# Patient Record
Sex: Female | Born: 1970 | Race: Black or African American | Hispanic: No | Marital: Single | Smoking: Current some day smoker
Health system: Southern US, Community
[De-identification: ages and names within clinical notes are randomized; demographics above are authoritative.]

## PROBLEM LIST (undated history)

## (undated) DIAGNOSIS — K3533 Acute appendicitis with perforation and localized peritonitis, with abscess: Secondary | ICD-10-CM

## (undated) DIAGNOSIS — J45909 Unspecified asthma, uncomplicated: Secondary | ICD-10-CM

## (undated) DIAGNOSIS — Z6828 Body mass index (BMI) 28.0-28.9, adult: Secondary | ICD-10-CM

## (undated) HISTORY — PX: APPENDECTOMY: SHX54

## (undated) HISTORY — PX: TUBAL LIGATION: SHX77

## (undated) HISTORY — PX: FOOT SURGERY: SHX648

---

## 2000-08-28 ENCOUNTER — Other Ambulatory Visit: Admission: RE | Admit: 2000-08-28 | Discharge: 2000-08-28 | Payer: Self-pay | Admitting: Internal Medicine

## 2004-11-26 ENCOUNTER — Emergency Department (HOSPITAL_COMMUNITY): Admission: EM | Admit: 2004-11-26 | Discharge: 2004-11-26 | Payer: Self-pay | Admitting: Family Medicine

## 2005-01-07 ENCOUNTER — Emergency Department (HOSPITAL_COMMUNITY): Admission: EM | Admit: 2005-01-07 | Discharge: 2005-01-07 | Payer: Self-pay | Admitting: Emergency Medicine

## 2007-04-25 ENCOUNTER — Emergency Department (HOSPITAL_COMMUNITY): Admission: EM | Admit: 2007-04-25 | Discharge: 2007-04-25 | Payer: Self-pay | Admitting: Emergency Medicine

## 2008-01-12 ENCOUNTER — Emergency Department (HOSPITAL_COMMUNITY): Admission: EM | Admit: 2008-01-12 | Discharge: 2008-01-12 | Payer: Self-pay | Admitting: Emergency Medicine

## 2008-06-21 ENCOUNTER — Emergency Department (HOSPITAL_COMMUNITY): Admission: EM | Admit: 2008-06-21 | Discharge: 2008-06-21 | Payer: Self-pay | Admitting: Emergency Medicine

## 2008-11-10 ENCOUNTER — Emergency Department (HOSPITAL_COMMUNITY): Admission: AC | Admit: 2008-11-10 | Discharge: 2008-11-10 | Payer: Self-pay

## 2008-11-11 ENCOUNTER — Emergency Department (HOSPITAL_COMMUNITY): Admission: EM | Admit: 2008-11-11 | Discharge: 2008-11-11 | Payer: Self-pay | Admitting: Emergency Medicine

## 2011-04-25 LAB — POCT URINALYSIS DIP (DEVICE)
Nitrite: NEGATIVE
Protein, ur: NEGATIVE
Specific Gravity, Urine: >=1.03
Urobilinogen, UA: 1
pH: 6.5

## 2011-04-25 LAB — WET PREP, GENITAL
Clue Cells Wet Prep HPF POC: NONE SEEN
WBC, Wet Prep HPF POC: NONE SEEN

## 2011-04-25 LAB — GC/CHLAMYDIA PROBE AMP, GENITAL
Chlamydia, DNA Probe: NEGATIVE
GC Probe Amp, Genital: NEGATIVE

## 2011-04-25 LAB — POCT PREGNANCY, URINE: Operator id: 239701

## 2011-05-23 ENCOUNTER — Emergency Department (HOSPITAL_COMMUNITY)
Admission: EM | Admit: 2011-05-23 | Discharge: 2011-05-23 | Disposition: A | Discharge status: Home / Self Care | Payer: Medicaid Other | Attending: Emergency Medicine | Admitting: Emergency Medicine

## 2011-05-23 ENCOUNTER — Encounter: Payer: Self-pay | Admitting: Adult Health

## 2011-05-23 DIAGNOSIS — J069 Acute upper respiratory infection, unspecified: Secondary | ICD-10-CM | POA: Insufficient documentation

## 2011-05-23 DIAGNOSIS — H9209 Otalgia, unspecified ear: Secondary | ICD-10-CM | POA: Insufficient documentation

## 2011-05-23 DIAGNOSIS — R05 Cough: Secondary | ICD-10-CM | POA: Insufficient documentation

## 2011-05-23 DIAGNOSIS — R059 Cough, unspecified: Secondary | ICD-10-CM | POA: Insufficient documentation

## 2011-05-23 DIAGNOSIS — R509 Fever, unspecified: Secondary | ICD-10-CM | POA: Insufficient documentation

## 2011-05-23 MED ORDER — ALBUTEROL SULFATE HFA 108 (90 BASE) MCG/ACT IN AERS
2.0000 | INHALATION_SPRAY | RESPIRATORY_TRACT | Status: DC | PRN
  Administered 2011-05-23: 2 via RESPIRATORY_TRACT
  Filled 2011-05-23: qty 6.7

## 2011-05-23 MED ORDER — AZITHROMYCIN 250 MG PO TABS: 250.0000 mg | ORAL_TABLET | Freq: Every day | ORAL | Status: AC

## 2011-05-23 NOTE — ED Notes (Signed)
C/o upper respiratory symptoms, ran out of al;buterol and inhaler and sore throat. Alert and oriented.

## 2011-05-23 NOTE — ED Provider Notes (Signed)
History     CSN: [id number] Arrival date & time: 05/23/2011  1:07 PM   First MD Initiated Contact with Patient 05/23/11 1329      Chief Complaint  Patient presents with  . URI    (Consider location/radiation/quality/duration/timing/severity/associated sxs/prior treatment) Patient is a 40 y.o. female presenting with URI. The history is provided by the patient. No language interpreter was used.  URI The primary symptoms include fever, ear pain, sore throat and cough. Primary symptoms do not include headaches, wheezing, abdominal pain, nausea or vomiting. The current episode started 6 to 7 days ago. This is a new problem. The problem has not changed since onset.  Reports sinus and chest congestion x 1 weeks.  States that she had a fever of 101 on Tuesday.  Coughing up yellow sputum.  Sore throat started yesterday.  Denies difficulty swallowing, headache or chills today.   Past Medical History  Diagnosis Date  . Asthma     History reviewed. No pertinent past surgical history.  History reviewed. No pertinent family history.  History  Substance Use Topics  . Smoking status: Current Some Day Smoker  . Smokeless tobacco: Not on file  . Alcohol Use: Yes    OB History    Grav Para Term Preterm Abortions TAB SAB Ect Mult Living                  Review of Systems  Constitutional: Positive for fever.  HENT: Positive for ear pain and sore throat.   Respiratory: Positive for cough. Negative for wheezing.   Gastrointestinal: Negative for nausea, vomiting and abdominal pain.  Neurological: Negative for headaches.  All other systems reviewed and are negative.    Allergies  Penicillins  Home Medications   Current Outpatient Rx  Name Route Sig Dispense Refill  . ALBUTEROL SULFATE HFA 108 (90 BASE) MCG/ACT IN AERS Inhalation Inhale 2 puffs into the lungs every 4 (four) hours as needed. For tightness     . IPRATROPIUM-ALBUTEROL 18-103 MCG/ACT IN AERO Inhalation Inhale 2 puffs  into the lungs every 6 (six) hours as needed.     . ALBUTEROL SULFATE (5 MG/ML) 0.5% IN NEBU Nebulization Take 2.5 mg by nebulization every 4 (four) hours as needed.       BP 131/79  Pulse 114  Temp(Src) 99.5 F (37.5 C) (Oral)  Resp 18  SpO2 100%  Physical Exam  Nursing note and vitals reviewed. Constitutional: She is oriented to person, place, and time. She appears well-developed and well-nourished.  HENT:  Head: Normocephalic and atraumatic.  Right Ear: Hearing normal. Tympanic membrane is bulging.  Left Ear: Hearing normal. Tympanic membrane is bulging.  Mouth/Throat: Posterior oropharyngeal erythema present.  Eyes: Conjunctivae and EOM are normal. Pupils are equal, round, and reactive to light.  Neck: Normal range of motion. Neck supple.  Cardiovascular: Normal rate, regular rhythm, normal heart sounds and intact distal pulses.  Exam reveals no gallop and no friction rub.   No murmur heard. Pulmonary/Chest: Effort normal and breath sounds normal.  Abdominal: Soft. Bowel sounds are normal.  Musculoskeletal: Normal range of motion. She exhibits no edema and no tenderness.  Neurological: She is alert and oriented to person, place, and time. She has normal reflexes.  Skin: Skin is warm and dry.  Psychiatric: She has a normal mood and affect.    ED Course  Procedures (including critical care time)  Labs Reviewed - No data to display No results found.   No diagnosis found.  MDM          Jethro Bastos, NP 05/23/11 1558

## 2011-05-24 NOTE — ED Provider Notes (Signed)
Medical screening examination/treatment/procedure(s) were performed by non-physician practitioner and as supervising physician I was immediately available for consultation/collaboration.  Geoffery Lyons, MD 05/24/11 1536

## 2013-02-14 ENCOUNTER — Inpatient Hospital Stay (HOSPITAL_COMMUNITY)
Admission: EM | Admit: 2013-02-14 | Discharge: 2013-02-18 | DRG: 340 | Disposition: A | Discharge status: Home / Self Care | Payer: Medicaid Other | Attending: General Surgery | Admitting: General Surgery

## 2013-02-14 ENCOUNTER — Encounter (HOSPITAL_COMMUNITY): Admission: EM | Disposition: A | Discharge status: Home / Self Care | Payer: Self-pay | Source: Home / Self Care

## 2013-02-14 ENCOUNTER — Emergency Department (HOSPITAL_COMMUNITY): Payer: Medicaid Other

## 2013-02-14 ENCOUNTER — Encounter (HOSPITAL_COMMUNITY): Payer: Self-pay | Admitting: *Deleted

## 2013-02-14 DIAGNOSIS — K3580 Unspecified acute appendicitis: Secondary | ICD-10-CM

## 2013-02-14 DIAGNOSIS — K35209 Acute appendicitis with generalized peritonitis, without abscess, unspecified as to perforation: Principal | ICD-10-CM | POA: Diagnosis present

## 2013-02-14 DIAGNOSIS — K3533 Acute appendicitis with perforation and localized peritonitis, with abscess: Secondary | ICD-10-CM | POA: Diagnosis present

## 2013-02-14 DIAGNOSIS — J45909 Unspecified asthma, uncomplicated: Secondary | ICD-10-CM | POA: Diagnosis present

## 2013-02-14 DIAGNOSIS — Z9851 Tubal ligation status: Secondary | ICD-10-CM

## 2013-02-14 DIAGNOSIS — R112 Nausea with vomiting, unspecified: Secondary | ICD-10-CM | POA: Diagnosis present

## 2013-02-14 DIAGNOSIS — Z6828 Body mass index (BMI) 28.0-28.9, adult: Secondary | ICD-10-CM

## 2013-02-14 DIAGNOSIS — K352 Acute appendicitis with generalized peritonitis, without abscess: Principal | ICD-10-CM | POA: Diagnosis present

## 2013-02-14 DIAGNOSIS — R51 Headache: Secondary | ICD-10-CM | POA: Diagnosis present

## 2013-02-14 DIAGNOSIS — F17200 Nicotine dependence, unspecified, uncomplicated: Secondary | ICD-10-CM | POA: Diagnosis present

## 2013-02-14 HISTORY — DX: Body mass index (bmi) 28.0-28.9, adult: Z68.28

## 2013-02-14 HISTORY — DX: Acute appendicitis with perforation and localized peritonitis, with abscess: K35.33

## 2013-02-14 HISTORY — PX: LAPAROSCOPIC APPENDECTOMY: SHX408

## 2013-02-14 HISTORY — DX: Unspecified asthma, uncomplicated: J45.909

## 2013-02-14 LAB — URINALYSIS W MICROSCOPIC + REFLEX CULTURE
Glucose, UA: NEGATIVE mg/dL
Leukocytes, UA: NEGATIVE
pH: 7 (ref 5.0–8.0)

## 2013-02-14 LAB — CBC WITH DIFFERENTIAL/PLATELET
Basophils Absolute: 0 10*3/uL (ref 0.0–0.1)
Lymphs Abs: 1.1 10*3/uL (ref 0.7–4.0)
MCV: 73.6 fL — ABNORMAL LOW (ref 78.0–100.0)
Monocytes Relative: 6 % (ref 3–12)
Neutrophils Relative %: 88 % — ABNORMAL HIGH (ref 43–77)
Platelets: 344 10*3/uL (ref 150–400)
RDW: 14.4 % (ref 11.5–15.5)
WBC: 17.5 10*3/uL — ABNORMAL HIGH (ref 4.0–10.5)

## 2013-02-14 LAB — COMPREHENSIVE METABOLIC PANEL
AST: 15 U/L (ref 0–37)
Albumin: 4 g/dL (ref 3.5–5.2)
Calcium: 9.9 mg/dL (ref 8.4–10.5)
Chloride: 101 mEq/L (ref 96–112)
Creatinine, Ser: 0.74 mg/dL (ref 0.50–1.10)
Sodium: 139 mEq/L (ref 135–145)
Total Bilirubin: 0.4 mg/dL (ref 0.3–1.2)

## 2013-02-14 SURGERY — APPENDECTOMY, LAPAROSCOPIC
Anesthesia: General | Wound class: Contaminated

## 2013-02-14 MED ORDER — CIPROFLOXACIN IN D5W 400 MG/200ML IV SOLN
400.0000 mg | Freq: Two times a day (BID) | INTRAVENOUS | Status: DC
  Administered 2013-02-14 – 2013-02-15 (×3): 400 mg via INTRAVENOUS
  Filled 2013-02-14 (×4): qty 200

## 2013-02-14 MED ORDER — IOHEXOL 300 MG/ML  SOLN
50.0000 mL | Freq: Once | INTRAMUSCULAR | Status: AC | PRN
  Administered 2013-02-14: 50 mL via ORAL

## 2013-02-14 MED ORDER — ONDANSETRON HCL 4 MG/2ML IJ SOLN
4.0000 mg | Freq: Once | INTRAMUSCULAR | Status: AC
  Administered 2013-02-14: 4 mg via INTRAVENOUS
  Filled 2013-02-14: qty 2

## 2013-02-14 MED ORDER — FENTANYL CITRATE 0.05 MG/ML IJ SOLN
50.0000 ug | Freq: Once | INTRAMUSCULAR | Status: AC
  Administered 2013-02-14: 50 ug via INTRAVENOUS
  Filled 2013-02-14: qty 2

## 2013-02-14 MED ORDER — SODIUM CHLORIDE 0.9 % IV BOLUS (SEPSIS)
1000.0000 mL | Freq: Once | INTRAVENOUS | Status: AC
  Administered 2013-02-14: 1000 mL via INTRAVENOUS

## 2013-02-14 MED ORDER — HYDROMORPHONE HCL PF 1 MG/ML IJ SOLN
1.0000 mg | Freq: Once | INTRAMUSCULAR | Status: AC
Start: 2013-02-14 — End: 2013-02-14
  Administered 2013-02-14: 1 mg via INTRAVENOUS
  Filled 2013-02-14: qty 1

## 2013-02-14 MED ORDER — HYDROMORPHONE HCL PF 1 MG/ML IJ SOLN
1.0000 mg | Freq: Once | INTRAMUSCULAR | Status: AC
  Administered 2013-02-14: 1 mg via INTRAVENOUS
  Filled 2013-02-14: qty 1

## 2013-02-14 MED ORDER — SODIUM CHLORIDE 0.9 % IV SOLN
Freq: Once | INTRAVENOUS | Status: AC
  Administered 2013-02-14: 20:00:00 via INTRAVENOUS

## 2013-02-14 MED ORDER — METRONIDAZOLE IN NACL 5-0.79 MG/ML-% IV SOLN
500.0000 mg | Freq: Three times a day (TID) | INTRAVENOUS | Status: DC
  Administered 2013-02-14 – 2013-02-16 (×5): 500 mg via INTRAVENOUS
  Filled 2013-02-14 (×6): qty 100

## 2013-02-14 MED ORDER — IOHEXOL 300 MG/ML  SOLN
100.0000 mL | Freq: Once | INTRAMUSCULAR | Status: AC | PRN
  Administered 2013-02-14: 100 mL via INTRAVENOUS

## 2013-02-14 SURGICAL SUPPLY — 40 items
APPLIER CLIP 5 13 M/L LIGAMAX5 (MISCELLANEOUS)
APPLIER CLIP ROT 10 11.4 M/L (STAPLE) ×2
CANISTER SUCTION 2500CC (MISCELLANEOUS) ×2 IMPLANT
CHLORAPREP W/TINT 26ML (MISCELLANEOUS) ×2 IMPLANT
CLIP APPLIE 5 13 M/L LIGAMAX5 (MISCELLANEOUS) IMPLANT
CLIP APPLIE ROT 10 11.4 M/L (STAPLE) ×1 IMPLANT
CLOTH BEACON ORANGE TIMEOUT ST (SAFETY) ×2 IMPLANT
CUTTER FLEX LINEAR 45M (STAPLE) ×2 IMPLANT
DECANTER SPIKE VIAL GLASS SM (MISCELLANEOUS) IMPLANT
DERMABOND ADVANCED (GAUZE/BANDAGES/DRESSINGS) ×1
DERMABOND ADVANCED .7 DNX12 (GAUZE/BANDAGES/DRESSINGS) ×1 IMPLANT
DRAPE LAPAROSCOPIC ABDOMINAL (DRAPES) ×2 IMPLANT
ELECT REM PT RETURN 9FT ADLT (ELECTROSURGICAL) ×2
ELECTRODE REM PT RTRN 9FT ADLT (ELECTROSURGICAL) ×1 IMPLANT
GLOVE BIOGEL PI IND STRL 7.0 (GLOVE) ×1 IMPLANT
GLOVE BIOGEL PI INDICATOR 7.0 (GLOVE) ×1
GLOVE SS BIOGEL STRL SZ 7.5 (GLOVE) ×1 IMPLANT
GLOVE SUPERSENSE BIOGEL SZ 7.5 (GLOVE) ×1
GOWN STRL NON-REIN LRG LVL3 (GOWN DISPOSABLE) ×2 IMPLANT
GOWN STRL REIN XL XLG (GOWN DISPOSABLE) ×4 IMPLANT
IV LACTATED RINGERS 1000ML (IV SOLUTION) ×4 IMPLANT
KIT BASIN OR (CUSTOM PROCEDURE TRAY) ×2 IMPLANT
NS IRRIG 1000ML POUR BTL (IV SOLUTION) ×2 IMPLANT
PENCIL BUTTON HOLSTER BLD 10FT (ELECTRODE) IMPLANT
POUCH SPECIMEN RETRIEVAL 10MM (ENDOMECHANICALS) ×2 IMPLANT
RELOAD 45 VASCULAR/THIN (ENDOMECHANICALS) IMPLANT
RELOAD STAPLE TA45 3.5 REG BLU (ENDOMECHANICALS) ×2 IMPLANT
SCALPEL HARMONIC ACE (MISCELLANEOUS) ×2 IMPLANT
SET IRRIG TUBING LAPAROSCOPIC (IRRIGATION / IRRIGATOR) ×4 IMPLANT
SOLUTION ANTI FOG 6CC (MISCELLANEOUS) ×2 IMPLANT
STRIP CLOSURE SKIN 1/2X4 (GAUZE/BANDAGES/DRESSINGS) IMPLANT
SUT MNCRL AB 4-0 PS2 18 (SUTURE) ×2 IMPLANT
TOWEL OR 17X26 10 PK STRL BLUE (TOWEL DISPOSABLE) ×2 IMPLANT
TRAY FOLEY CATH 14FRSI W/METER (CATHETERS) ×2 IMPLANT
TRAY LAP CHOLE (CUSTOM PROCEDURE TRAY) ×2 IMPLANT
TROCAR BLADELESS OPT 5 75 (ENDOMECHANICALS) ×2 IMPLANT
TROCAR XCEL 12X100 BLDLESS (ENDOMECHANICALS) ×4 IMPLANT
TROCAR XCEL BLUNT TIP 100MML (ENDOMECHANICALS) ×2 IMPLANT
TROCAR Z-THREAD FIOS 12X100MM (TROCAR) ×2 IMPLANT
TUBING INSUFFLATION 10FT LAP (TUBING) ×2 IMPLANT

## 2013-02-14 NOTE — H&P (Signed)
Stephanie Mcgrath is an 42 y.o. female.   Chief Complaint: abdominal pain, nausea and vomiting  HPI: patient states that about 24 hours ago after eating some food cookout she developed the onset of nausea and infrequent vomiting. She initially thought she might have had food poisoning. However today she developed progressive abdominal pain. She describes constant aching pain mostly in her epigastrium with some radiation to the lower abdomen as well. The pain has been constant and worse with motion. She's continued to have nausea and vomiting. She presented to the emergency room for evaluation. No GU symptoms. No history of similar problems or chronic GI complaints.  Past Medical History  Diagnosis Date  . Asthma     Past Surgical History  Procedure Laterality Date  . Foot surgery      left  . Tubal ligation    . Cesarean section      No family history on file. Social History:  reports that she has been smoking.  She has never used smokeless tobacco. She reports that  drinks alcohol. She reports that she does not use illicit drugs.  Allergies:  Allergies  Allergen Reactions  . Penicillins Swelling    Eyes swell   No current facility-administered medications for this encounter.   Current Outpatient Prescriptions  Medication Sig Dispense Refill  . albuterol (PROVENTIL HFA;VENTOLIN HFA) 108 (90 BASE) MCG/ACT inhaler Inhale 2 puffs into the lungs every 4 (four) hours as needed. For tightness       . albuterol-ipratropium (COMBIVENT) 18-103 MCG/ACT inhaler Inhale 2 puffs into the lungs every 6 (six) hours as needed.       . Lorcaserin HCl (BELVIQ) 10 MG TABS Take 20 mg by mouth daily.      . Multiple Vitamin (MULTIVITAMIN WITH MINERALS) TABS Take 1 tablet by mouth daily.      Marland Kitchen albuterol (PROVENTIL) (5 MG/ML) 0.5% nebulizer solution Take 2.5 mg by nebulization every 4 (four) hours as needed.          Results for orders placed during the hospital encounter of 02/14/13 (from the past 48  hour(s))  CBC WITH DIFFERENTIAL     Status: Abnormal   Collection Time    02/14/13  6:17 PM      Result Value Range   WBC 17.5 (*) 4.0 - 10.5 K/uL   RBC 5.12 (*) 3.87 - 5.11 MIL/uL   Hemoglobin 12.4  12.0 - 15.0 g/dL   HCT 16.1  09.6 - 04.5 %   MCV 73.6 (*) 78.0 - 100.0 fL   MCH 24.2 (*) 26.0 - 34.0 pg   MCHC 32.9  30.0 - 36.0 g/dL   RDW 40.9  81.1 - 91.4 %   Platelets 344  150 - 400 K/uL   Neutrophils Relative % 88 (*) 43 - 77 %   Lymphocytes Relative 6 (*) 12 - 46 %   Monocytes Relative 6  3 - 12 %   Eosinophils Relative 0  0 - 5 %   Basophils Relative 0  0 - 1 %   Neutro Abs 15.3 (*) 1.7 - 7.7 K/uL   Lymphs Abs 1.1  0.7 - 4.0 K/uL   Monocytes Absolute 1.1 (*) 0.1 - 1.0 K/uL   Eosinophils Absolute 0.0  0.0 - 0.7 K/uL   Basophils Absolute 0.0  0.0 - 0.1 K/uL   Smear Review MORPHOLOGY UNREMARKABLE    COMPREHENSIVE METABOLIC PANEL     Status: Abnormal   Collection Time    02/14/13  6:17  PM      Result Value Range   Sodium 139  135 - 145 mEq/L   Potassium 3.6  3.5 - 5.1 mEq/L   Chloride 101  96 - 112 mEq/L   CO2 27  19 - 32 mEq/L   Glucose, Bld 108 (*) 70 - 99 mg/dL   BUN 11  6 - 23 mg/dL   Creatinine, Ser 1.61  0.50 - 1.10 mg/dL   Calcium 9.9  8.4 - 09.6 mg/dL   Total Protein 7.6  6.0 - 8.3 g/dL   Albumin 4.0  3.5 - 5.2 g/dL   AST 15  0 - 37 U/L   ALT 5  0 - 35 U/L   Alkaline Phosphatase 63  39 - 117 U/L   Total Bilirubin 0.4  0.3 - 1.2 mg/dL   GFR calc non Af Amer >90  >90 mL/min   GFR calc Af Amer >90  >90 mL/min   Comment:            The eGFR has been calculated     using the CKD EPI equation.     This calculation has not been     validated in all clinical     situations.     eGFR's persistently     <90 mL/min signify     possible Chronic Kidney Disease.  LIPASE, BLOOD     Status: None   Collection Time    02/14/13  6:17 PM      Result Value Range   Lipase 23  11 - 59 U/L  URINALYSIS W MICROSCOPIC + REFLEX CULTURE     Status: Abnormal   Collection Time     02/14/13  7:21 PM      Result Value Range   Color, Urine YELLOW  YELLOW   APPearance CLEAR  CLEAR   Specific Gravity, Urine 1.022  1.005 - 1.030   pH 7.0  5.0 - 8.0   Glucose, UA NEGATIVE  NEGATIVE mg/dL   Hgb urine dipstick LARGE (*) NEGATIVE   Bilirubin Urine NEGATIVE  NEGATIVE   Ketones, ur NEGATIVE  NEGATIVE mg/dL   Protein, ur NEGATIVE  NEGATIVE mg/dL   Urobilinogen, UA 0.2  0.0 - 1.0 mg/dL   Nitrite NEGATIVE  NEGATIVE   Leukocytes, UA NEGATIVE  NEGATIVE   WBC, UA 0-2  <3 WBC/hpf   RBC / HPF 21-50  <3 RBC/hpf   Squamous Epithelial / LPF RARE  RARE   Urine-Other MUCOUS PRESENT    POCT PREGNANCY, URINE     Status: None   Collection Time    02/14/13  7:32 PM      Result Value Range   Preg Test, Ur NEGATIVE  NEGATIVE   Comment:            THE SENSITIVITY OF THIS     METHODOLOGY IS >24 mIU/mL   US Abdomen Complete  02/14/2013   *RADIOLOGY REPORT*  Clinical Data:  Abdominal pain, emesis  ABDOMINAL ULTRASOUND COMPLETE  Comparison:  None.  Findings:  Gallbladder:  No gallstones, gallbladder wall thickening, or pericholecystic fluid.  Common Bile Duct:  Within normal limits in caliber.  Liver: No focal mass lesion identified.  Within normal limits in parenchymal echogenicity.  IVC:  Appears normal.  Pancreas:  No abnormality identified.  Spleen:  Within normal limits in size and echotexture.  Right kidney:  Normal in size and parenchymal echogenicity.  No evidence of mass or hydronephrosis.  Left kidney:  Normal in size and  parenchymal echogenicity.  No evidence of mass or hydronephrosis.  Abdominal Aorta:  No aneurysm identified.  IMPRESSION: No acute finding by abdominal ultrasound   Original Report Authenticated By: Judie Petit. Miles Costain, M.D.   Ct Abdomen Pelvis W Contrast  02/14/2013   *RADIOLOGY REPORT*  Clinical Data: Abdominal pain, emesis  CT ABDOMEN AND PELVIS WITH CONTRAST  Technique:  Multidetector CT imaging of the abdomen and pelvis was performed following the standard protocol during  bolus administration of intravenous contrast.  Contrast: 50mL OMNIPAQUE IOHEXOL 300 MG/ML  SOLN, OMNIPAQUE IOHEXOL 300 MG/ML  SOLN  Comparison: 02/14/2013 ultrasound  Findings: Lung bases clear.  Normal heart size.  No pericardial or pleural effusion.  No hiatal hernia.  Abdomen:  Liver demonstrates scattered sub centimeter hypodensities in the dome, too small to definitively characterize but suspect small hepatic cysts.  Liver is scanned in the early portal phase. Therefore the hepatic veins are not well opacified.  Left hepatic lobe medial segment demonstrates subcapsular hypoattenuation, suspect geographic fatty infiltration.  Gallbladder, biliary system, pancreas, spleen, adrenal glands, and kidneys demonstrate no acute process and are within normal limits for age.  Negative for bowel obstruction, dilatation, ileus, or free air.  In the right lower quadrant, the appendix is fluid distended with enhancement and surrounding inflammation compatible with acute appendicitis, non ruptured.  Pelvis:  Tubal ligation clips noted.  Uterus normal in size.  Small dominant follicle measures 14 mm on the left ovary.  Small amount pelvic free fluid dependently.  No other acute distal bowel process involving the colon.  Colon is collapsed.  No pelvic hemorrhage, abscess, adenopathy, inguinal abnormality, or hernia.  Mild degenerative changes of the spine.  No acute osseous finding.  IMPRESSION: Acute non ruptured appendicitis.  Hepatic sub-centimeter hypodensities, too small to definitively characterize but suspect small cysts  Triangular subcapsular hypodense area in the left hepatic lobe medial segment, suspect geographic fatty infiltration  Prior tubal ligation  14 mm dominant left ovarian follicle  Trace pelvic free fluid  Critical Value/emergent results were called by telephone at the time of interpretation on 02/14/2013 at 9:35 p.m. to Dr. Gwendolyn Grant, who verbally acknowledged these results.   Original Report  Authenticated By: Judie Petit. Miles Costain, M.D.    Review of Systems  Constitutional: Negative for fever and chills.  HENT: Negative.   Respiratory: Negative.   Cardiovascular: Negative.   Gastrointestinal: Positive for nausea, vomiting and abdominal pain. Negative for diarrhea, constipation and blood in stool.  Genitourinary: Negative.     Blood pressure 121/75, pulse 75, temperature 98.7 F (37.1 C), temperature source Oral, resp. rate 20, last menstrual period 02/08/2013, SpO2 95.00%. Physical Exam  General: Alert, moderately obese African American female, in no distress Skin: Warm and dry without rash or infection. HEENT: No palpable masses or thyromegaly. Sclera nonicteric. Pupils equal round and reactive. Oropharynx clear. Lymph nodes: No cervical, supraclavicular, or inguinal nodes palpable. Lungs: Breath sounds clear and equal without increased work of breathing Cardiovascular: Regular rate and rhythm without murmur. No JVD or edema. Peripheral pulses intact. Abdomen: Nondistended. Somewhat diffuse tenderness and guarding, greater on the right side of the abdomen than the left. No discernible masses or organomegaly. No hernias. Extremities: No edema or joint swelling or deformity. No chronic venous stasis changes. Neurologic: Alert and fully oriented. Gait normal.   Assessment/Plan I have personally reviewed her imaging studies. Her tenderness is not well localized to the right lower quadrant but CT scan clearly shows acute appendicitis without other abnormalities in her presentation  exam is consistent with this. I recommended emergency laparoscopic appendectomy. We discussed the nature of the procedure and its indications and recovery as well as risks of anesthetic complications, cardiorespiratory complications, bleeding and infection and possible need for open surgery. All questions were answered. She is receiving broad-spectrum IV antibiotics.  Dmarius Reeder T 02/14/2013, 10:32 PM

## 2013-02-14 NOTE — ED Provider Notes (Signed)
Medical screening examination/treatment/procedure(s) were conducted as a shared visit with non-physician practitioner(s) and myself.  I personally evaluated the patient during the encounter I evaluated patient, 24 hours about abdominal pain. Mostly RUQ pain initally, RUQ negative. CT scan shows appendicitis. Surgery admitting.  Dagmar Hait, MD 02/14/13 [phone]

## 2013-02-14 NOTE — ED Provider Notes (Signed)
CSN: [id number]     Arrival date & time 02/14/13  1732 History     First MD Initiated Contact with Patient 02/14/13 1748     Chief Complaint  Patient presents with  . Abdominal Pain  . Emesis   (Consider location/radiation/quality/duration/timing/severity/associated sxs/prior Treatment) HPI Stephanie Mcgrath is a 42 y.o. female who presents to ED with complaint of nausea vomiting, abdominal pain. States symptoms began last night around 10pm. Stats not too long prior to that she ate a bloody hamburger from cook out. States persistent vomiting since then. No hematemesis. No diarrhea. No fever, chills. States today took zofran and took oxycodone with no improvement. Pt had tubal ligation 2 wks ago. States doing well since then. Pt denies anyone in the family sick with the same.    Past Medical History  Diagnosis Date  . Asthma    Past Surgical History  Procedure Laterality Date  . Foot surgery      left  . Tubal ligation    . Cesarean section     No family history on file. History  Substance Use Topics  . Smoking status: Current Some Day Smoker  . Smokeless tobacco: Never Used  . Alcohol Use: Yes   OB History   Grav Para Term Preterm Abortions TAB SAB Ect Mult Living                 Review of Systems  Constitutional: Negative for fever and chills.  HENT: Negative for neck pain and neck stiffness.   Respiratory: Negative.   Cardiovascular: Negative.   Gastrointestinal: Positive for nausea, vomiting and abdominal pain. Negative for diarrhea, constipation, blood in stool and rectal pain.  Genitourinary: Negative for dysuria, flank pain, vaginal bleeding, vaginal discharge, vaginal pain and pelvic pain.  Musculoskeletal: Negative.   Skin: Negative.   Neurological: Negative for weakness, numbness and headaches.  All other systems reviewed and are negative.    Allergies  Penicillins  Home Medications   Current Outpatient Rx  Name  Route  Sig  Dispense  Refill  .  albuterol (PROVENTIL HFA;VENTOLIN HFA) 108 (90 BASE) MCG/ACT inhaler   Inhalation   Inhale 2 puffs into the lungs every 4 (four) hours as needed. For tightness          . albuterol-ipratropium (COMBIVENT) 18-103 MCG/ACT inhaler   Inhalation   Inhale 2 puffs into the lungs every 6 (six) hours as needed.          . Lorcaserin HCl (BELVIQ) 10 MG TABS   Oral   Take 20 mg by mouth daily.         . Multiple Vitamin (MULTIVITAMIN WITH MINERALS) TABS   Oral   Take 1 tablet by mouth daily.         Marland Kitchen albuterol (PROVENTIL) (5 MG/ML) 0.5% nebulizer solution   Nebulization   Take 2.5 mg by nebulization every 4 (four) hours as needed.           BP 137/73  Pulse 72  Temp(Src) 99.2 F (37.3 C) (Oral)  Resp 18  SpO2 100%  LMP 02/08/2013 Physical Exam  Nursing note and vitals reviewed. Constitutional: She appears well-developed and well-nourished.  Uncomfortable appearing  Eyes: Conjunctivae are normal.  Neck: Neck supple.  Cardiovascular: Normal rate, regular rhythm and normal heart sounds.   Pulmonary/Chest: Effort normal and breath sounds normal. No respiratory distress. She has no wheezes. She has no rales.  Abdominal: Soft. Bowel sounds are normal. She exhibits no distension. There is  tenderness. There is guarding.  RUQ and epigastric tenderness. No CVA tenderness  Musculoskeletal: She exhibits no edema.    ED Course   Procedures (including critical care time)  Results for orders placed during the hospital encounter of 02/14/13  CBC WITH DIFFERENTIAL      Result Value Range   WBC 17.5 (*) 4.0 - 10.5 K/uL   RBC 5.12 (*) 3.87 - 5.11 MIL/uL   Hemoglobin 12.4  12.0 - 15.0 g/dL   HCT 16.1  09.6 - 04.5 %   MCV 73.6 (*) 78.0 - 100.0 fL   MCH 24.2 (*) 26.0 - 34.0 pg   MCHC 32.9  30.0 - 36.0 g/dL   RDW 40.9  81.1 - 91.4 %   Platelets 344  150 - 400 K/uL   Neutrophils Relative % 88 (*) 43 - 77 %   Lymphocytes Relative 6 (*) 12 - 46 %   Monocytes Relative 6  3 - 12 %    Eosinophils Relative 0  0 - 5 %   Basophils Relative 0  0 - 1 %   Neutro Abs 15.3 (*) 1.7 - 7.7 K/uL   Lymphs Abs 1.1  0.7 - 4.0 K/uL   Monocytes Absolute 1.1 (*) 0.1 - 1.0 K/uL   Eosinophils Absolute 0.0  0.0 - 0.7 K/uL   Basophils Absolute 0.0  0.0 - 0.1 K/uL   Smear Review MORPHOLOGY UNREMARKABLE    COMPREHENSIVE METABOLIC PANEL      Result Value Range   Sodium 139  135 - 145 mEq/L   Potassium 3.6  3.5 - 5.1 mEq/L   Chloride 101  96 - 112 mEq/L   CO2 27  19 - 32 mEq/L   Glucose, Bld 108 (*) 70 - 99 mg/dL   BUN 11  6 - 23 mg/dL   Creatinine, Ser 7.82  0.50 - 1.10 mg/dL   Calcium 9.9  8.4 - 95.6 mg/dL   Total Protein 7.6  6.0 - 8.3 g/dL   Albumin 4.0  3.5 - 5.2 g/dL   AST 15  0 - 37 U/L   ALT 5  0 - 35 U/L   Alkaline Phosphatase 63  39 - 117 U/L   Total Bilirubin 0.4  0.3 - 1.2 mg/dL   GFR calc non Af Amer >90  >90 mL/min   GFR calc Af Amer >90  >90 mL/min  LIPASE, BLOOD      Result Value Range   Lipase 23  11 - 59 U/L  URINALYSIS W MICROSCOPIC + REFLEX CULTURE      Result Value Range   Color, Urine YELLOW  YELLOW   APPearance CLEAR  CLEAR   Specific Gravity, Urine 1.022  1.005 - 1.030   pH 7.0  5.0 - 8.0   Glucose, UA NEGATIVE  NEGATIVE mg/dL   Hgb urine dipstick LARGE (*) NEGATIVE   Bilirubin Urine NEGATIVE  NEGATIVE   Ketones, ur NEGATIVE  NEGATIVE mg/dL   Protein, ur NEGATIVE  NEGATIVE mg/dL   Urobilinogen, UA 0.2  0.0 - 1.0 mg/dL   Nitrite NEGATIVE  NEGATIVE   Leukocytes, UA NEGATIVE  NEGATIVE   WBC, UA 0-2  <3 WBC/hpf   RBC / HPF 21-50  <3 RBC/hpf   Squamous Epithelial / LPF RARE  RARE   Urine-Other MUCOUS PRESENT    POCT PREGNANCY, URINE      Result Value Range   Preg Test, Ur NEGATIVE  NEGATIVE   US Abdomen Complete  02/14/2013   *RADIOLOGY REPORT*  Clinical Data:  Abdominal pain, emesis  ABDOMINAL ULTRASOUND COMPLETE  Comparison:  None.  Findings:  Gallbladder:  No gallstones, gallbladder wall thickening, or pericholecystic fluid.  Common Bile Duct:   Within normal limits in caliber.  Liver: No focal mass lesion identified.  Within normal limits in parenchymal echogenicity.  IVC:  Appears normal.  Pancreas:  No abnormality identified.  Spleen:  Within normal limits in size and echotexture.  Right kidney:  Normal in size and parenchymal echogenicity.  No evidence of mass or hydronephrosis.  Left kidney:  Normal in size and parenchymal echogenicity.  No evidence of mass or hydronephrosis.  Abdominal Aorta:  No aneurysm identified.  IMPRESSION: No acute finding by abdominal ultrasound   Original Report Authenticated By: Judie Petit. Miles Costain, M.D.   Ct Abdomen Pelvis W Contrast  02/14/2013   *RADIOLOGY REPORT*  Clinical Data: Abdominal pain, emesis  CT ABDOMEN AND PELVIS WITH CONTRAST  Technique:  Multidetector CT imaging of the abdomen and pelvis was performed following the standard protocol during bolus administration of intravenous contrast.  Contrast: 50mL OMNIPAQUE IOHEXOL 300 MG/ML  SOLN, OMNIPAQUE IOHEXOL 300 MG/ML  SOLN  Comparison: 02/14/2013 ultrasound  Findings: Lung bases clear.  Normal heart size.  No pericardial or pleural effusion.  No hiatal hernia.  Abdomen:  Liver demonstrates scattered sub centimeter hypodensities in the dome, too small to definitively characterize but suspect small hepatic cysts.  Liver is scanned in the early portal phase. Therefore the hepatic veins are not well opacified.  Left hepatic lobe medial segment demonstrates subcapsular hypoattenuation, suspect geographic fatty infiltration.  Gallbladder, biliary system, pancreas, spleen, adrenal glands, and kidneys demonstrate no acute process and are within normal limits for age.  Negative for bowel obstruction, dilatation, ileus, or free air.  In the right lower quadrant, the appendix is fluid distended with enhancement and surrounding inflammation compatible with acute appendicitis, non ruptured.  Pelvis:  Tubal ligation clips noted.  Uterus normal in size.  Small dominant follicle  measures 14 mm on the left ovary.  Small amount pelvic free fluid dependently.  No other acute distal bowel process involving the colon.  Colon is collapsed.  No pelvic hemorrhage, abscess, adenopathy, inguinal abnormality, or hernia.  Mild degenerative changes of the spine.  No acute osseous finding.  IMPRESSION: Acute non ruptured appendicitis.  Hepatic sub-centimeter hypodensities, too small to definitively characterize but suspect small cysts  Triangular subcapsular hypodense area in the left hepatic lobe medial segment, suspect geographic fatty infiltration  Prior tubal ligation  14 mm dominant left ovarian follicle  Trace pelvic free fluid  Critical Value/emergent results were called by telephone at the time of interpretation on 02/14/2013 at 9:35 p.m. to Dr. Gwendolyn Grant, who verbally acknowledged these results.   Original Report Authenticated By: Judie Petit. Shick, M.D.     1. Acute appendicitis     MDM  Pt in ED with complaint of nausea, vomiting, abdominal pain onset yesterday. Initially, pt's abdominal pain was mainly in the upper abdomen. Lab work showed elevated WBC, otherwise unremarkable. Pt did have guarding in RUQ, [place] was initially ordered with concern for a gallbladder disase. [place] negative. Due to pt's continued pain and tenderness, CT abdomen was obtained. CT showed acute appendicitis. Pt's pain in ED treated with dilaudid and fentanyl with some improvement with each dose. Her VS are normal.   Spoke with Dr. Johna Sheriff with general surgery, will come see pt.   Filed Vitals:   02/14/13 1741 02/14/13 1830 02/14/13 1845 02/14/13 2136  BP:  137/73   121/75  Pulse: 72 97 74 75  Temp: 99.2 F (37.3 C)   98.7 F (37.1 C)  TempSrc: Oral   Oral  Resp: 18   20  SpO2: 100% 100% 98% 95%     Perline Awe A Naleyah Ohlinger, PA-C 02/14/13 2352

## 2013-02-14 NOTE — Anesthesia Preprocedure Evaluation (Addendum)
Anesthesia Evaluation  Patient identified by MRN, date of birth, ID band Patient awake    Reviewed: Allergy & Precautions, H&P , NPO status , Patient's Chart, lab work & pertinent test results  History of Anesthesia Complications (+) PONV  Airway Mallampati: II TM Distance: >3 FB Neck ROM: full    Dental  (+) Chipped and Dental Advisory Given Small chips both upper front teeth:   Pulmonary neg pulmonary ROS, asthma , Current Smoker,  breath sounds clear to auscultation  Pulmonary exam normal       Cardiovascular Exercise Tolerance: Good negative cardio ROS  Rhythm:regular Rate:Normal     Neuro/Psych negative neurological ROS  negative psych ROS   GI/Hepatic negative GI ROS, Neg liver ROS,   Endo/Other  negative endocrine ROS  Renal/GU negative Renal ROS  negative genitourinary   Musculoskeletal   Abdominal   Peds  Hematology negative hematology ROS (+)   Anesthesia Other Findings   Reproductive/Obstetrics negative OB ROS                          Anesthesia Physical Anesthesia Plan  ASA: II  Anesthesia Plan: General   Post-op Pain Management:    Induction: Intravenous, Rapid sequence and Cricoid pressure planned  Airway Management Planned: Oral ETT  Additional Equipment:   Intra-op Plan:   Post-operative Plan: Extubation in OR  Informed Consent: I have reviewed the patients History and Physical, chart, labs and discussed the procedure including the risks, benefits and alternatives for the proposed anesthesia with the patient or authorized representative who has indicated his/her understanding and acceptance.   Dental Advisory Given  Plan Discussed with: CRNA and Surgeon  Anesthesia Plan Comments:         Anesthesia Quick Evaluation

## 2013-02-14 NOTE — ED Notes (Signed)
Patient transported to CT 

## 2013-02-14 NOTE — ED Notes (Signed)
Pt states went to cook out last night, got a hamburger, ate half of it and realized it was "bleeding", then around 10:30 pm last night started vomiting and having abdominal pain, pt states has been vomiting all day, having severe abdominal pain radiating into back.

## 2013-02-15 ENCOUNTER — Encounter (HOSPITAL_COMMUNITY): Payer: Self-pay | Admitting: *Deleted

## 2013-02-15 ENCOUNTER — Emergency Department (HOSPITAL_COMMUNITY): Payer: Medicaid Other | Admitting: Anesthesiology

## 2013-02-15 ENCOUNTER — Encounter (HOSPITAL_COMMUNITY): Payer: Self-pay | Admitting: Anesthesiology

## 2013-02-15 DIAGNOSIS — K3580 Unspecified acute appendicitis: Secondary | ICD-10-CM

## 2013-02-15 LAB — CBC
MCV: 73.8 fL — ABNORMAL LOW (ref 78.0–100.0)
Platelets: 287 10*3/uL (ref 150–400)
RBC: 4.47 MIL/uL (ref 3.87–5.11)
WBC: 18.5 10*3/uL — ABNORMAL HIGH (ref 4.0–10.5)

## 2013-02-15 MED ORDER — ROCURONIUM BROMIDE 100 MG/10ML IV SOLN
INTRAVENOUS | Status: DC | PRN
  Administered 2013-02-15: 30 mg via INTRAVENOUS

## 2013-02-15 MED ORDER — LORCASERIN HCL 10 MG PO TABS: 20.0000 mg | ORAL_TABLET | Freq: Every day | ORAL | Status: DC

## 2013-02-15 MED ORDER — ALBUTEROL SULFATE (5 MG/ML) 0.5% IN NEBU: 2.5000 mg | INHALATION_SOLUTION | RESPIRATORY_TRACT | Status: DC | PRN

## 2013-02-15 MED ORDER — BUPIVACAINE-EPINEPHRINE (PF) 0.5% -1:200000 IJ SOLN
INTRAMUSCULAR | Status: AC
  Filled 2013-02-15: qty 10

## 2013-02-15 MED ORDER — FENTANYL CITRATE 0.05 MG/ML IJ SOLN
INTRAMUSCULAR | Status: DC | PRN
Start: 2013-02-15 — End: 2013-02-15
  Administered 2013-02-15: 100 ug via INTRAVENOUS

## 2013-02-15 MED ORDER — HEPARIN SODIUM (PORCINE) 5000 UNIT/ML IJ SOLN
5000.0000 [IU] | Freq: Three times a day (TID) | INTRAMUSCULAR | Status: DC
  Filled 2013-02-15 (×4): qty 1

## 2013-02-15 MED ORDER — ONDANSETRON HCL 4 MG/2ML IJ SOLN
INTRAMUSCULAR | Status: DC | PRN
  Administered 2013-02-15: 4 mg via INTRAVENOUS

## 2013-02-15 MED ORDER — MIDAZOLAM HCL 5 MG/5ML IJ SOLN
INTRAMUSCULAR | Status: DC | PRN
  Administered 2013-02-15: 2 mg via INTRAVENOUS

## 2013-02-15 MED ORDER — CALCIUM CARBONATE ANTACID 500 MG PO CHEW
400.0000 mg | CHEWABLE_TABLET | Freq: Three times a day (TID) | ORAL | Status: DC | PRN
  Administered 2013-02-15 – 2013-02-16 (×2): 400 mg via ORAL
  Filled 2013-02-15 (×2): qty 2

## 2013-02-15 MED ORDER — LACTATED RINGERS IV SOLN
INTRAVENOUS | Status: DC | PRN
  Administered 2013-02-15 (×2): via INTRAVENOUS

## 2013-02-15 MED ORDER — SUCCINYLCHOLINE CHLORIDE 20 MG/ML IJ SOLN
INTRAMUSCULAR | Status: DC | PRN
  Administered 2013-02-15: 100 mg via INTRAVENOUS

## 2013-02-15 MED ORDER — GLYCOPYRROLATE 0.2 MG/ML IJ SOLN
INTRAMUSCULAR | Status: DC | PRN
  Administered 2013-02-15: 0.6 mg via INTRAVENOUS

## 2013-02-15 MED ORDER — KCL IN DEXTROSE-NACL 20-5-0.9 MEQ/L-%-% IV SOLN
INTRAVENOUS | Status: DC
  Administered 2013-02-15 – 2013-02-16 (×2): via INTRAVENOUS
  Filled 2013-02-15 (×4): qty 1000

## 2013-02-15 MED ORDER — HEPARIN SODIUM (PORCINE) 5000 UNIT/ML IJ SOLN
5000.0000 [IU] | Freq: Three times a day (TID) | INTRAMUSCULAR | Status: DC
  Administered 2013-02-15 – 2013-02-18 (×9): 5000 [IU] via SUBCUTANEOUS
  Filled 2013-02-15 (×13): qty 1

## 2013-02-15 MED ORDER — DIPHENHYDRAMINE HCL 50 MG/ML IJ SOLN: 12.5000 mg | INTRAMUSCULAR | Status: DC | PRN

## 2013-02-15 MED ORDER — LACTATED RINGERS IV SOLN: INTRAVENOUS | Status: DC

## 2013-02-15 MED ORDER — PROPOFOL 10 MG/ML IV BOLUS
INTRAVENOUS | Status: DC | PRN
  Administered 2013-02-15: 150 mg via INTRAVENOUS

## 2013-02-15 MED ORDER — HYDROMORPHONE HCL PF 1 MG/ML IJ SOLN: 0.2500 mg | INTRAMUSCULAR | Status: DC | PRN

## 2013-02-15 MED ORDER — NEOSTIGMINE METHYLSULFATE 1 MG/ML IJ SOLN
INTRAMUSCULAR | Status: DC | PRN
  Administered 2013-02-15: 5 mg via INTRAVENOUS

## 2013-02-15 MED ORDER — ONDANSETRON HCL 4 MG PO TABS
4.0000 mg | ORAL_TABLET | Freq: Four times a day (QID) | ORAL | Status: DC | PRN
  Administered 2013-02-15 – 2013-02-17 (×4): 4 mg via ORAL
  Filled 2013-02-15 (×4): qty 1

## 2013-02-15 MED ORDER — PROMETHAZINE HCL 25 MG/ML IJ SOLN: 12.5000 mg | INTRAMUSCULAR | Status: DC | PRN

## 2013-02-15 MED ORDER — OXYCODONE-ACETAMINOPHEN 5-325 MG PO TABS
1.0000 | ORAL_TABLET | ORAL | Status: DC | PRN
  Administered 2013-02-15 – 2013-02-16 (×2): 1 via ORAL
  Administered 2013-02-16: 2 via ORAL
  Administered 2013-02-16 – 2013-02-17 (×2): 1 via ORAL
  Filled 2013-02-15 (×2): qty 1
  Filled 2013-02-15: qty 2
  Filled 2013-02-15: qty 1
  Filled 2013-02-15: qty 2
  Filled 2013-02-15: qty 1

## 2013-02-15 MED ORDER — MORPHINE SULFATE 2 MG/ML IJ SOLN
2.0000 mg | INTRAMUSCULAR | Status: DC | PRN
  Administered 2013-02-15 (×2): 2 mg via INTRAVENOUS
  Administered 2013-02-15: 4 mg via INTRAVENOUS
  Administered 2013-02-15 (×2): 2 mg via INTRAVENOUS
  Filled 2013-02-15 (×3): qty 1
  Filled 2013-02-15: qty 2
  Filled 2013-02-15: qty 1

## 2013-02-15 MED ORDER — LACTATED RINGERS IR SOLN
Status: DC | PRN
  Administered 2013-02-15: 1000 mL

## 2013-02-15 MED ORDER — DEXAMETHASONE SODIUM PHOSPHATE 10 MG/ML IJ SOLN
INTRAMUSCULAR | Status: DC | PRN
  Administered 2013-02-15: 10 mg via INTRAVENOUS

## 2013-02-15 MED ORDER — BUPIVACAINE-EPINEPHRINE 0.5% -1:200000 IJ SOLN
INTRAMUSCULAR | Status: DC | PRN
  Administered 2013-02-15: 30 mL

## 2013-02-15 MED ORDER — LIDOCAINE HCL (CARDIAC) 20 MG/ML IV SOLN
INTRAVENOUS | Status: DC | PRN
  Administered 2013-02-15: 100 mg via INTRAVENOUS

## 2013-02-15 MED ORDER — ONDANSETRON HCL 4 MG/2ML IJ SOLN
4.0000 mg | Freq: Four times a day (QID) | INTRAMUSCULAR | Status: DC | PRN
  Administered 2013-02-15 (×3): 4 mg via INTRAVENOUS
  Filled 2013-02-15 (×3): qty 2

## 2013-02-15 NOTE — Progress Notes (Addendum)
Patient ID: Stephanie Mcgrath, female   DOB: February 20, 1971, 42 y.o.   MRN: [id number] 1 Day Post-Op  Subjective: Doing well, hungry, sore, denies n/v  Objective: Vital signs in last 24 hours: Temp:  [98.3 F (36.8 C)-99.2 F (37.3 C)] 99 F (37.2 C) (08/04 1030) Pulse Rate:  [57-97] 62 (08/04 1030) Resp:  [15-20] 16 (08/04 1030) BP: (103-138)/(68-113) 113/69 mmHg (08/04 1030) SpO2:  [95 %-100 %] 98 % (08/04 1030) Weight:  [140 lb (63.504 kg)] 140 lb (63.504 kg) (08/04 0237) Last BM Date: 02/14/13  Intake/Output from previous day: 08/03 0701 - 08/04 0700 In: 1621.7 [I.V.:1621.7] Out: 625 [Urine:625] Intake/Output this shift: Total I/O In: 240 [P.O.:240] Out: 125 [Urine:125]  PE: Abd: soft, tender over incisions, c/d/i, +BS General: NAD  Lab Results:   Recent Labs  02/14/13 1817 02/15/13 0635  WBC 17.5* 18.5*  HGB 12.4 10.6*  HCT 37.7 33.0*  PLT 344 287   BMET  Recent Labs  02/14/13 1817  NA 139  K 3.6  CL 101  CO2 27  GLUCOSE 108*  BUN 11  CREATININE 0.74  CALCIUM 9.9   PT/INR No results found for this basename: LABPROT, INR,  in the last 72 hours CMP     Component Value Date/Time   NA 139 02/14/2013 1817   K 3.6 02/14/2013 1817   CL 101 02/14/2013 1817   CO2 27 02/14/2013 1817   GLUCOSE 108* 02/14/2013 1817   BUN 11 02/14/2013 1817   CREATININE 0.74 02/14/2013 1817   CALCIUM 9.9 02/14/2013 1817   PROT 7.6 02/14/2013 1817   ALBUMIN 4.0 02/14/2013 1817   AST 15 02/14/2013 1817   ALT 5 02/14/2013 1817   ALKPHOS 63 02/14/2013 1817   BILITOT 0.4 02/14/2013 1817   GFRNONAA >90 02/14/2013 1817   GFRAA >90 02/14/2013 1817   Lipase     Component Value Date/Time   LIPASE 23 02/14/2013 1817       Studies/Results: US Abdomen Complete  02/14/2013   *RADIOLOGY REPORT*  Clinical Data:  Abdominal pain, emesis  ABDOMINAL ULTRASOUND COMPLETE  Comparison:  None.  Findings:  Gallbladder:  No gallstones, gallbladder wall thickening, or pericholecystic fluid.  Common Bile Duct:  Within normal  limits in caliber.  Liver: No focal mass lesion identified.  Within normal limits in parenchymal echogenicity.  IVC:  Appears normal.  Pancreas:  No abnormality identified.  Spleen:  Within normal limits in size and echotexture.  Right kidney:  Normal in size and parenchymal echogenicity.  No evidence of mass or hydronephrosis.  Left kidney:  Normal in size and parenchymal echogenicity.  No evidence of mass or hydronephrosis.  Abdominal Aorta:  No aneurysm identified.  IMPRESSION: No acute finding by abdominal ultrasound   Original Report Authenticated By: Judie Petit. Miles Costain, M.D.   Ct Abdomen Pelvis W Contrast  02/14/2013   *RADIOLOGY REPORT*  Clinical Data: Abdominal pain, emesis  CT ABDOMEN AND PELVIS WITH CONTRAST  Technique:  Multidetector CT imaging of the abdomen and pelvis was performed following the standard protocol during bolus administration of intravenous contrast.  Contrast: 50mL OMNIPAQUE IOHEXOL 300 MG/ML  SOLN, OMNIPAQUE IOHEXOL 300 MG/ML  SOLN  Comparison: 02/14/2013 ultrasound  Findings: Lung bases clear.  Normal heart size.  No pericardial or pleural effusion.  No hiatal hernia.  Abdomen:  Liver demonstrates scattered sub centimeter hypodensities in the dome, too small to definitively characterize but suspect small hepatic cysts.  Liver is scanned in the early portal phase. Therefore the hepatic veins  are not well opacified.  Left hepatic lobe medial segment demonstrates subcapsular hypoattenuation, suspect geographic fatty infiltration.  Gallbladder, biliary system, pancreas, spleen, adrenal glands, and kidneys demonstrate no acute process and are within normal limits for age.  Negative for bowel obstruction, dilatation, ileus, or free air.  In the right lower quadrant, the appendix is fluid distended with enhancement and surrounding inflammation compatible with acute appendicitis, non ruptured.  Pelvis:  Tubal ligation clips noted.  Uterus normal in size.  Small dominant follicle measures 14 mm  on the left ovary.  Small amount pelvic free fluid dependently.  No other acute distal bowel process involving the colon.  Colon is collapsed.  No pelvic hemorrhage, abscess, adenopathy, inguinal abnormality, or hernia.  Mild degenerative changes of the spine.  No acute osseous finding.  IMPRESSION: Acute non ruptured appendicitis.  Hepatic sub-centimeter hypodensities, too small to definitively characterize but suspect small cysts  Triangular subcapsular hypodense area in the left hepatic lobe medial segment, suspect geographic fatty infiltration  Prior tubal ligation  14 mm dominant left ovarian follicle  Trace pelvic free fluid  Critical Value/emergent results were called by telephone at the time of interpretation on 02/14/2013 at 9:35 p.m. to Dr. Gwendolyn Grant, who verbally acknowledged these results.   Original Report Authenticated By: Judie Petit. Miles Costain, M.D.    Anti-infectives: Anti-infectives   Start     Dose/Rate Route Frequency Ordered Stop   02/14/13 2300  ciprofloxacin (CIPRO) IVPB 400 mg     400 mg 200 mL/hr over 60 Minutes Intravenous Every 12 hours 02/14/13 2247     02/14/13 2300  metroNIDAZOLE (FLAGYL) IVPB 500 mg     500 mg 100 mL/hr over 60 Minutes Intravenous Every 8 hours 02/14/13 2247         Assessment/Plan POD#1-lap appy - 02/15/2013 - [facility]. [facility]: leukocytosis on admission, will keep on IV abx today and recheck labs in AM, if normalizing then she may go home, will advance diet as tolerated today, OOB and ambulate.    LOS: 1 day    WHITE, ELIZABETH 02/15/2013   Agree with above. Worried about getting back to work.  She works as a Firefighter.  Ovidio Kin, MD, [facility] Pager: [phone] Office phone:  [phone]

## 2013-02-15 NOTE — Anesthesia Postprocedure Evaluation (Signed)
  Anesthesia Post-op Note  Patient: Stephanie Mcgrath  Procedure(s) Performed: Procedure(s) (LRB): APPENDECTOMY LAPAROSCOPIC (N/A)  Patient Location: PACU  Anesthesia Type: General  Level of Consciousness: awake and alert   Airway and Oxygen Therapy: Patient Spontanous Breathing  Post-op Pain: mild  Post-op Assessment: Post-op Vital signs reviewed, Patient's Cardiovascular Status Stable, Respiratory Function Stable, Patent Airway and No signs of Nausea or vomiting  Last Vitals:  Filed Vitals:   02/14/13 2136  BP: 121/75  Pulse: 75  Temp: 37.1 C  Resp: 20    Post-op Vital Signs: stable   Complications: No apparent anesthesia complications

## 2013-02-15 NOTE — Transfer of Care (Signed)
Immediate Anesthesia Transfer of Care Note  Patient: Stephanie Mcgrath  Procedure(s) Performed: Procedure(s): APPENDECTOMY LAPAROSCOPIC (N/A)  Patient Location: PACU  Anesthesia Type:General  Level of Consciousness: sedated  Airway & Oxygen Therapy: Patient Spontanous Breathing and Patient connected to face mask oxygen  Post-op Assessment: Report given to PACU RN and Post -op Vital signs reviewed and stable  Post vital signs: Reviewed and stable  Complications: No apparent anesthesia complications

## 2013-02-15 NOTE — Op Note (Signed)
Preoperative Diagnosis: Acute appendicitis [540.9]  Postoprative Diagnosis: Acute appendicitis [540.9]  Procedure: Procedure(s): APPENDECTOMY LAPAROSCOPIC   Surgeon: Glenna Fellows T   Assistants: None  Anesthesia:  General endotracheal anesthesia  Indications: patient presents with 24-hour of nausea and vomiting and worsening generalized abdominal pain. On exam she is diffusely tender but more on the right side with an elevated white count of 17,000. CT scan has shown evidence of acute appendicitis without perforation. I recommend proceeding with emergency laparoscopic appendectomy. We discussed the indications for the procedure and risks detailed elsewhere and she is agreeable  Procedure Detail:  Patient was brought to the operating room, placed in the supine position on the operating table, and general endotracheal anesthesia induced. Foley catheter was placed. She received broad-spectrum IV antibiotics preoperatively. The abdomen was widely sterilely prepped and draped. Patient time out was performed and correct procedure verified. Local anesthesia was used to infiltrate the trocar sites. A 1 cm incision was made at the umbilicus and dissection carried down to midline fascia which was incised for 1 cm. The peritoneum was entered under direct vision. Through mattress suture of 0 Vicryl the [place] trocar was placed and pneumoperitoneum established. Laparoscopy showed a small amount of bloody fluid in the abdomen. The patient is just 2 weeks post tubal ligation. The tubal clips were seen. Under direct vision a 5 mm trocar was placed in the right upper quadrant and a 12 mm trocar in the left lower quadrant. There were some inflammatory adhesions of the terminal ileum to the base of the cecum and appendix which were taken down with careful blunt dissection and the appendix exposed. It was freed from inflammatory adhesions and elevated. There was severe acute inflammation with exudate but no evidence of  perforation or gangrene. The appendix was elevated and the mesentery of the appendix sequentially divided with the harmonic scalpel. The appendix was completely freed down to its base at the junction of the tip of the cecum. At this point the appendix was not inflamed. The appendix was then divided across the tip of the cecum with a single firing of the Endo GIA blue load 45 mm stapler. The staple line was intact and without bleeding. The pelvis internal abdomen was thoroughly irrigated. Hemostasis was assured. The appendix was placed in an Endo Catch bag and brought out through the umbilical incision. The mattress suture was secured. The skin incisions were closed with subcuticular Monocryl and Dermabond. Sponge needle and stomach counts were correct.    Findings: Acute appendicitis with exudate but no evidence of perforation or gangrene  Estimated Blood Loss:  Minimal         Drains: none  Blood Given: none          Specimens: appendix        Complications:  * No complications entered in OR log *         Disposition: PACU - hemodynamically stable.         Condition: stable

## 2013-02-15 NOTE — Care Management Note (Signed)
    Page 1 of 1   02/15/2013     1:32:37 PM   CARE MANAGEMENT NOTE 02/15/2013  Patient:  Stephanie Mcgrath, Stephanie Mcgrath   Account Number:  [id number]  Date Initiated:  02/15/2013  Documentation initiated by:  Lorenda Ishihara  Subjective/Objective Assessment:   42 yo female admitted s/p lap appy. PTA lived at home with family.     Action/Plan:   Home when stable   Anticipated DC Date:  02/16/2013   Anticipated DC Plan:  HOME/SELF CARE      DC Planning Services  CM consult      Choice offered to / List presented to:             Status of service:  Completed, signed off Medicare Important Message given?   (If response is "NO", the following Medicare IM given date fields will be blank) Date Medicare IM given:   Date Additional Medicare IM given:    Discharge Disposition:  HOME/SELF CARE  Per UR Regulation:  Reviewed for med. necessity/level of care/duration of stay  If discussed at Long Length of Stay Meetings, dates discussed:    Comments:

## 2013-02-16 LAB — CBC
HCT: 29.5 % — ABNORMAL LOW (ref 36.0–46.0)
Hemoglobin: 9.5 g/dL — ABNORMAL LOW (ref 12.0–15.0)
MCHC: 32.2 g/dL (ref 30.0–36.0)
RBC: 3.99 MIL/uL (ref 3.87–5.11)

## 2013-02-16 MED ORDER — IBUPROFEN 600 MG PO TABS
600.0000 mg | ORAL_TABLET | Freq: Four times a day (QID) | ORAL | Status: DC | PRN
  Administered 2013-02-16: 600 mg via ORAL
  Filled 2013-02-16: qty 1

## 2013-02-16 MED ORDER — CIPROFLOXACIN HCL 500 MG PO TABS
500.0000 mg | ORAL_TABLET | Freq: Two times a day (BID) | ORAL | Status: DC
  Administered 2013-02-16 – 2013-02-18 (×5): 500 mg via ORAL
  Filled 2013-02-16 (×7): qty 1

## 2013-02-16 MED ORDER — METRONIDAZOLE 500 MG PO TABS
500.0000 mg | ORAL_TABLET | Freq: Three times a day (TID) | ORAL | Status: DC
  Administered 2013-02-16 – 2013-02-18 (×6): 500 mg via ORAL
  Filled 2013-02-16 (×9): qty 1

## 2013-02-16 NOTE — Progress Notes (Signed)
Telephone order from Sherrie George for CBC in the morning and that is ok to leave patient's IV out but if patient starts to experience nausea or vomiting to restart IV access on patient, orders entered Stanford Breed RN 02-16-2013 17:36pm

## 2013-02-16 NOTE — Progress Notes (Signed)
2 Days Post-Op  Subjective: Vomited turkeyburger last PM.  Sleepy from the percocet, still very sore.  Hasn't walked much so far.   Objective: Vital signs in last 24 hours: Temp:  [97.7 F (36.5 C)-99.4 F (37.4 C)] 98.7 F (37.1 C) (08/05 0549) Pulse Rate:  [53-71] 71 (08/05 0549) Resp:  [16] 16 (08/05 0549) BP: (107-133)/(69-81) 107/74 mmHg (08/05 0549) SpO2:  [98 %-100 %] 98 % (08/05 0549) Last BM Date: 02/14/13 Afebrile, VSS WBC is 16.4 still up but trending down. Diet: regular  Intake/Output from previous day: 08/04 0701 - 08/05 0700 In: 3780 [P.O.:1080; I.V.:2400; IV Piggyback:300] Out: 2275 [Urine:2275] Intake/Output this shift:    General appearance: alert, cooperative and no distress GI: soft, still very tender, few BS, incisions look fine.  Lab Results:   Recent Labs  02/15/13 0635 02/16/13 0350  WBC 18.5* 16.4*  HGB 10.6* 9.5*  HCT 33.0* 29.5*  PLT 287 256    BMET  Recent Labs  02/14/13 1817  NA 139  K 3.6  CL 101  CO2 27  GLUCOSE 108*  BUN 11  CREATININE 0.74  CALCIUM 9.9   PT/INR No results found for this basename: LABPROT, INR,  in the last 72 hours   Recent Labs Lab 02/14/13 1817  AST 15  ALT 5  ALKPHOS 63  BILITOT 0.4  PROT 7.6  ALBUMIN 4.0     Lipase     Component Value Date/Time   LIPASE 23 02/14/2013 1817     Studies/Results: US Abdomen Complete  02/14/2013   *RADIOLOGY REPORT*  Clinical Data:  Abdominal pain, emesis  ABDOMINAL ULTRASOUND COMPLETE  Comparison:  None.  Findings:  Gallbladder:  No gallstones, gallbladder wall thickening, or pericholecystic fluid.  Common Bile Duct:  Within normal limits in caliber.  Liver: No focal mass lesion identified.  Within normal limits in parenchymal echogenicity.  IVC:  Appears normal.  Pancreas:  No abnormality identified.  Spleen:  Within normal limits in size and echotexture.  Right kidney:  Normal in size and parenchymal echogenicity.  No evidence of mass or hydronephrosis.  Left  kidney:  Normal in size and parenchymal echogenicity.  No evidence of mass or hydronephrosis.  Abdominal Aorta:  No aneurysm identified.  IMPRESSION: No acute finding by abdominal ultrasound   Original Report Authenticated By: Judie Petit. Miles Costain, M.D.   Ct Abdomen Pelvis W Contrast  02/14/2013   *RADIOLOGY REPORT*  Clinical Data: Abdominal pain, emesis  CT ABDOMEN AND PELVIS WITH CONTRAST  Technique:  Multidetector CT imaging of the abdomen and pelvis was performed following the standard protocol during bolus administration of intravenous contrast.  Contrast: 50mL OMNIPAQUE IOHEXOL 300 MG/ML  SOLN, OMNIPAQUE IOHEXOL 300 MG/ML  SOLN  Comparison: 02/14/2013 ultrasound  Findings: Lung bases clear.  Normal heart size.  No pericardial or pleural effusion.  No hiatal hernia.  Abdomen:  Liver demonstrates scattered sub centimeter hypodensities in the dome, too small to definitively characterize but suspect small hepatic cysts.  Liver is scanned in the early portal phase. Therefore the hepatic veins are not well opacified.  Left hepatic lobe medial segment demonstrates subcapsular hypoattenuation, suspect geographic fatty infiltration.  Gallbladder, biliary system, pancreas, spleen, adrenal glands, and kidneys demonstrate no acute process and are within normal limits for age.  Negative for bowel obstruction, dilatation, ileus, or free air.  In the right lower quadrant, the appendix is fluid distended with enhancement and surrounding inflammation compatible with acute appendicitis, non ruptured.  Pelvis:  Tubal ligation clips  noted.  Uterus normal in size.  Small dominant follicle measures 14 mm on the left ovary.  Small amount pelvic free fluid dependently.  No other acute distal bowel process involving the colon.  Colon is collapsed.  No pelvic hemorrhage, abscess, adenopathy, inguinal abnormality, or hernia.  Mild degenerative changes of the spine.  No acute osseous finding.  IMPRESSION: Acute non ruptured appendicitis.   Hepatic sub-centimeter hypodensities, too small to definitively characterize but suspect small cysts  Triangular subcapsular hypodense area in the left hepatic lobe medial segment, suspect geographic fatty infiltration  Prior tubal ligation  14 mm dominant left ovarian follicle  Trace pelvic free fluid  Critical Value/emergent results were called by telephone at the time of interpretation on 02/14/2013 at 9:35 p.m. to Dr. Gwendolyn Grant, who verbally acknowledged these results.   Original Report Authenticated By: Judie Petit. Miles Costain, M.D.    Medications: . ciprofloxacin  400 mg Intravenous Q12H  . heparin  5,000 Units Subcutaneous Q8H  . Lorcaserin HCl  20 mg Oral Daily  . metronidazole  500 mg Intravenous Q8H    Assessment/Plan Acute appendicitis with severe acute inflammation with exudate but no evidence of perforation or gangrene APPENDECTOMY LAPAROSCOPIC, 02/15/2013, Mariella Saa, MD  S/P tubal ligation recent past.  Plan:  Add Motrin, change to oral antibiotics, mobilize, and see how she does.  If she is feeling better may send home later today.    LOS: 2 days    JENNINGS,WILLARD 02/16/2013  Agree with above. Probably not quite ready to go home.  Discussed with patient.  Ovidio Kin, MD, [facility] Pager: [phone] Office phone:  [phone]

## 2013-02-17 ENCOUNTER — Encounter (HOSPITAL_COMMUNITY): Payer: Self-pay | Admitting: General Surgery

## 2013-02-17 DIAGNOSIS — Z6828 Body mass index (BMI) 28.0-28.9, adult: Secondary | ICD-10-CM

## 2013-02-17 DIAGNOSIS — J45909 Unspecified asthma, uncomplicated: Secondary | ICD-10-CM

## 2013-02-17 DIAGNOSIS — K3533 Acute appendicitis with perforation and localized peritonitis, with abscess: Secondary | ICD-10-CM

## 2013-02-17 HISTORY — DX: Unspecified asthma, uncomplicated: J45.909

## 2013-02-17 HISTORY — DX: Acute appendicitis with perforation, localized peritonitis, and gangrene, with abscess: K35.33

## 2013-02-17 HISTORY — DX: Body mass index (BMI) 28.0-28.9, adult: Z68.28

## 2013-02-17 LAB — CBC
HCT: 30.9 % — ABNORMAL LOW (ref 36.0–46.0)
Hemoglobin: 9.7 g/dL — ABNORMAL LOW (ref 12.0–15.0)
MCH: 23.2 pg — ABNORMAL LOW (ref 26.0–34.0)
MCV: 73.7 fL — ABNORMAL LOW (ref 78.0–100.0)
RBC: 4.19 MIL/uL (ref 3.87–5.11)

## 2013-02-17 MED ORDER — FAMOTIDINE 20 MG PO TABS: 20.0000 mg | ORAL_TABLET | Freq: Two times a day (BID) | ORAL | Status: DC

## 2013-02-17 MED ORDER — ALPRAZOLAM 0.25 MG PO TABS
0.2500 mg | ORAL_TABLET | Freq: Three times a day (TID) | ORAL | Status: DC | PRN
  Administered 2013-02-18: 0.25 mg via ORAL
  Filled 2013-02-17: qty 1

## 2013-02-17 MED ORDER — OXYCODONE HCL 5 MG PO TABS: 5.0000 mg | ORAL_TABLET | ORAL | Status: DC | PRN

## 2013-02-17 MED ORDER — KCL IN DEXTROSE-NACL 20-5-0.9 MEQ/L-%-% IV SOLN
INTRAVENOUS | Status: DC
  Administered 2013-02-17 – 2013-02-18 (×2): via INTRAVENOUS
  Filled 2013-02-17 (×3): qty 1000

## 2013-02-17 MED ORDER — CIPROFLOXACIN HCL 500 MG PO TABS: 500.0000 mg | ORAL_TABLET | Freq: Two times a day (BID) | ORAL | Status: DC

## 2013-02-17 MED ORDER — METRONIDAZOLE 500 MG PO TABS: 500.0000 mg | ORAL_TABLET | Freq: Three times a day (TID) | ORAL | Status: DC

## 2013-02-17 MED ORDER — OXYCODONE HCL 5 MG PO TABS
5.0000 mg | ORAL_TABLET | ORAL | Status: DC | PRN
  Administered 2013-02-17 – 2013-02-18 (×3): 5 mg via ORAL
  Filled 2013-02-17 (×3): qty 1

## 2013-02-17 MED ORDER — IBUPROFEN 200 MG PO TABS: ORAL_TABLET | ORAL | Status: AC

## 2013-02-17 MED ORDER — FAMOTIDINE 20 MG PO TABS
20.0000 mg | ORAL_TABLET | Freq: Two times a day (BID) | ORAL | Status: DC
  Administered 2013-02-17 – 2013-02-18 (×2): 20 mg via ORAL
  Filled 2013-02-17 (×3): qty 1

## 2013-02-17 MED ORDER — ASPIRIN 325 MG PO TABS
325.0000 mg | ORAL_TABLET | Freq: Once | ORAL | Status: AC
  Administered 2013-02-17: 325 mg via ORAL
  Filled 2013-02-17: qty 1

## 2013-02-17 MED ORDER — ASPIRIN-ACETAMINOPHEN-CAFFEINE 250-250-65 MG PO TABS
2.0000 | ORAL_TABLET | Freq: Four times a day (QID) | ORAL | Status: DC | PRN
  Administered 2013-02-17: 2 via ORAL
  Filled 2013-02-17: qty 2

## 2013-02-17 NOTE — Progress Notes (Signed)
Spoke to [employer] informed patient wondering about discharge and c/o chills and nausea concerned may have flu, VSS, HR in 50s. Will states he will be up shortly.

## 2013-02-17 NOTE — Progress Notes (Signed)
Pt still having nausea, vomited once this AM, has not eaten lunch.  Ordered [employer].  She is very anxious, her son's father died earlier this year after a medicine complication while in the hospital.  She is also having issues with her family.  Plan:  I'm going to put her IV back in and hydrate tonight.  I am going to give her something IV and PO for nausea, soft diet, no [employer].  Add some Pepcid, and recheck labs in AM.  Agree with above.  Ovidio Kin, MD, [facility] Pager: [phone] Office phone:  [phone]

## 2013-02-17 NOTE — Progress Notes (Signed)
Spoke to [employer], [place] informed him patient given zofran po at 1009, patient feels that excedrin made her nauseated, but has just vomited approx or less of clear with frothy white emesis, patient states tastes like orange juice, PA states to continue to monitor and do not restart IV at this time.

## 2013-02-17 NOTE — Progress Notes (Signed)
IV team to restart IV per patient request for IV in hand and anxiety about restart.

## 2013-02-17 NOTE — Progress Notes (Signed)
3 Days Post-Op  Subjective: Complains of headache all day yesterday, had some nausea, yesterday also.  Still has headache and wants a BC, she says that works for her. Complains of abdomen being sore still.  Percocet did not help headache.  Objective: Vital signs in last 24 hours: Temp:  [98.2 F (36.8 C)-98.3 F (36.8 C)] 98.3 F (36.8 C) (08/06 0554) Pulse Rate:  [54-58] 58 (08/06 0554) Resp:  [16] 16 (08/06 0554) BP: (114-131)/(74-82) 119/81 mmHg (08/06 0554) SpO2:  [97 %-99 %] 97 % (08/06 0554) Last BM Date: 02/14/13 Afebrile, VSS WBC down to 6.3.  Intake/Output from previous day: 08/05 0701 - 08/06 0700 In: 720 [P.O.:720] Out: 1950 [Urine:1950] Intake/Output this shift:    General appearance: alert, cooperative, no distress, eyes covered from light because of headache. GI: soft tender, +BS, incisions look fine.  Lab Results:   Recent Labs  02/16/13 0350 02/17/13 0400  WBC 16.4* 6.3  HGB 9.5* 9.7*  HCT 29.5* 30.9*  PLT 256 265    BMET  Recent Labs  02/14/13 1817  NA 139  K 3.6  CL 101  CO2 27  GLUCOSE 108*  BUN 11  CREATININE 0.74  CALCIUM 9.9   PT/INR No results found for this basename: LABPROT, INR,  in the last 72 hours   Recent Labs Lab 02/14/13 1817  AST 15  ALT 5  ALKPHOS 63  BILITOT 0.4  PROT 7.6  ALBUMIN 4.0     Lipase     Component Value Date/Time   LIPASE 23 02/14/2013 1817     Studies/Results: No results found.  Medications: . ciprofloxacin  500 mg Oral BID  . heparin  5,000 Units Subcutaneous Q8H  . Lorcaserin HCl  20 mg Oral Daily  . metroNIDAZOLE  500 mg Oral Q8H    Assessment/Plan Acute appendicitis with severe acute inflammation with exudate but no evidence of perforation or gangrene  APPENDECTOMY LAPAROSCOPIC, 02/15/2013, Mariella Saa, MD  S/P tubal ligation recent past.  Plan:  I will see what they have that is equivalent to [facility] and aim to send home later today.    LOS: 3 days     JENNINGS,WILLARD 02/17/2013  Hit a wall going home. See later note. Agree with above.  Ovidio Kin, MD, [facility] Pager: [phone] Office phone:  [phone]

## 2013-02-17 NOTE — Discharge Summary (Signed)
Physician Discharge Summary  Patient ID: Stephanie Mcgrath MRN: [id number] DOB/AGE: 07/24/1970 42 y.o.  Admit date: 02/14/2013 Discharge date: 02/18/2013  Admission Diagnoses:  Acute Appendicitis  Discharge Diagnoses: Acute Appendicitis Body mass index is 28.26 kg/(m^2).  Principal Problem:   Appendicitis, acute, with peritonitis Active Problems:   Unspecified asthma(493.90)   BMI 28.0-28.9,adult   PROCEDURES: laparoscopic Appendectomy 02/15/13 Dr. Sharlet Salina [facility] Course:  Patient states that about 24 hours ago after eating some food cookout she developed the onset of nausea and infrequent vomiting. She initially thought she might have had food poisoning. However today she developed progressive abdominal pain. She describes constant aching pain mostly in her epigastrium with some radiation to the lower abdomen as well. The pain has been constant and worse with motion. She's continued to have nausea and vomiting. She presented to the emergency room for evaluation. No GU symptoms. No history of similar problems or chronic GI complaints. She was seen by Dr. Johna Sheriff and taken to the OR with acute appendicitis.  Findings:  There was severe acute inflammation with exudate but no evidence of perforation or gangrene.  She tolerated the procedure well and returned to the floor.  She has had a slow course, with post op nausea and some vomiting.  She has been maintained on antibiotics.  Her WBC has come down from a hight of 18.5 post op to 6.3 today.  She had one episode of nausea yesterday after lunch.  That is better now.  She continues to complain of soreness and a headache.  If she does well with breakfast and lunch we hope to send her home after lunch with 3 more days of oral antibiotics. She will follow up with the [facility].  Appointment is listed below.  Condition on D/C:  Improved    Disposition: 01-Home or Self Care       Future Appointments Provider Department Dept Phone    03/02/2013 12:00 PM [facility], [place] [phone]       Medication List         albuterol (5 MG/ML) 0.5% nebulizer solution  Commonly known as:  PROVENTIL  Take 2.5 mg by nebulization every 4 (four) hours as needed.     albuterol 108 (90 BASE) MCG/ACT inhaler  Commonly known as:  PROVENTIL HFA;VENTOLIN HFA  Inhale 2 puffs into the lungs every 4 (four) hours as needed. For tightness     albuterol-ipratropium 18-103 MCG/ACT inhaler  Commonly known as:  COMBIVENT  Inhale 2 puffs into the lungs every 6 (six) hours as needed.     ALPRAZolam 0.25 MG tablet  Commonly known as:  XANAX  You can take 1/2 tablet up to 1 tablet every 8 hours as needed for anxiety.  See your primary care doctor if you want or need more.     ciprofloxacin 500 MG tablet  Commonly known as:  CIPRO  Take 1 tablet (500 mg total) by mouth 2 (two) times daily.     ibuprofen 200 MG tablet  Commonly known as:  ADVIL,MOTRIN  You can take 2-3 tablets every 6 hours for pain.     Lorcaserin HCl 10 MG Tabs  Commonly known as:  BELVIQ  Take 20 mg by mouth daily.     metroNIDAZOLE 500 MG tablet  Commonly known as:  FLAGYL  Take 1 tablet (500 mg total) by mouth every 8 (eight) hours.     multivitamin with minerals Tabs tablet  Take  1 tablet by mouth daily.     oxyCODONE 5 MG immediate release tablet  Commonly known as:  Oxy IR/ROXICODONE  Take 1-3 tablets (5-15 mg total) by mouth every 4 (four) hours as needed.     polyethylene glycol packet  Commonly known as:  MIRALAX / GLYCOLAX  You can use as needed for Bowel movements.  You can buy this over the counter.       Follow-up Information   Follow up with [facility]. Schedule an appointment as soon as possible for a visit in 3 weeks. (Come to the office 30 minutes before your appointment for check in.)    Contact information:   [address]   [place] [address] [phone]        Signed: Sherrie George 02/18/2013, 9:51 AM  Agree with above.  Ovidio Kin, MD, [facility] Pager: [phone] Office phone:  [phone]

## 2013-02-18 ENCOUNTER — Encounter: Payer: Self-pay | Admitting: General Surgery

## 2013-02-18 LAB — COMPREHENSIVE METABOLIC PANEL
AST: 18 U/L (ref 0–37)
Albumin: 2.6 g/dL — ABNORMAL LOW (ref 3.5–5.2)
Alkaline Phosphatase: 42 U/L (ref 39–117)
Chloride: 105 mEq/L (ref 96–112)
Potassium: 3.3 mEq/L — ABNORMAL LOW (ref 3.5–5.1)
Sodium: 138 mEq/L (ref 135–145)
Total Bilirubin: 0.1 mg/dL — ABNORMAL LOW (ref 0.3–1.2)
Total Protein: 5.5 g/dL — ABNORMAL LOW (ref 6.0–8.3)

## 2013-02-18 LAB — CBC
MCHC: 32.2 g/dL (ref 30.0–36.0)
Platelets: 282 10*3/uL (ref 150–400)
RDW: 14.5 % (ref 11.5–15.5)
WBC: 5.2 10*3/uL (ref 4.0–10.5)

## 2013-02-18 MED ORDER — ALPRAZOLAM 0.25 MG PO TABS: ORAL_TABLET | ORAL | Status: DC

## 2013-02-18 MED ORDER — POLYETHYLENE GLYCOL 3350 17 G PO PACK: PACK | ORAL | Status: DC

## 2013-02-18 MED ORDER — POLYETHYLENE GLYCOL 3350 17 G PO PACK
17.0000 g | PACK | Freq: Every day | ORAL | Status: DC | PRN
  Administered 2013-02-18: 17 g via ORAL
  Filled 2013-02-18: qty 1

## 2013-02-18 MED ORDER — LORCASERIN HCL 10 MG PO TABS: 20.0000 mg | ORAL_TABLET | Freq: Every day | ORAL | Status: DC

## 2013-02-18 NOTE — Progress Notes (Signed)
4 Days Post-Op  Subjective: Feels better, smiling!!  Objective: Vital signs in last 24 hours: Temp:  [98.4 F (36.9 C)-99 F (37.2 C)] 98.4 F (36.9 C) (08/07 0619) Pulse Rate:  [53-56] 53 (08/07 0619) Resp:  [18-20] 20 (08/07 0619) BP: (110-134)/(71-81) 110/71 mmHg (08/07 0619) SpO2:  [94 %-100 %] 94 % (08/07 0619) Last BM Date: 02/16/13 2 episodes of emesis recorded yesterday Back to full liquids last PM Afebrile, VSS K+ down some otherwise labs are normal  Intake/Output from previous day: 08/06 0701 - 08/07 0700 In: 1201.7 [I.V.:1201.7] Out: 1750 [Urine:1750] Intake/Output this shift:    General appearance: alert, cooperative and no distress Resp: clear to auscultation bilaterally GI: soft, still tender, +BS.  Incisions looks fine.  Lab Results:   Recent Labs  02/17/13 0400 02/18/13 0413  WBC 6.3 5.2  HGB 9.7* 10.2*  HCT 30.9* 31.7*  PLT 265 282    BMET  Recent Labs  02/18/13 0413  NA 138  K 3.3*  CL 105  CO2 25  GLUCOSE 122*  BUN 4*  CREATININE 0.81  CALCIUM 8.6   PT/INR No results found for this basename: LABPROT, INR,  in the last 72 hours   Recent Labs Lab 02/14/13 1817 02/18/13 0413  AST 15 18  ALT 5 12  ALKPHOS 63 42  BILITOT 0.4 0.1*  PROT 7.6 5.5*  ALBUMIN 4.0 2.6*     Lipase     Component Value Date/Time   LIPASE 23 02/14/2013 1817     Studies/Results: No results found.  Medications: . ciprofloxacin  500 mg Oral BID  . famotidine  20 mg Oral BID  . heparin  5,000 Units Subcutaneous Q8H  . metroNIDAZOLE  500 mg Oral Q8H    Assessment/Plan Appendicitis, acute, with peritonitis  Unspecified asthma BMI 28.0-28.9,adult  Plan:  Regular diet and home later if OK, she walked more last Pm and feels hungry.  It's her birthday and she would like to go home.     She tolerated breakfast, no further complaints of nausea, she feels she is ready to go home. Home on 3 more days of antibiotics.  LOS: 4 days     JENNINGS,WILLARD 02/18/2013  Agree with above. She seems to have finally turned the corner.  Ovidio Kin, MD, [facility] Pager: [phone] Office phone:  [phone]

## 2013-02-25 ENCOUNTER — Telehealth (INDEPENDENT_AMBULATORY_CARE_PROVIDER_SITE_OTHER): Payer: Self-pay

## 2013-02-25 NOTE — Telephone Encounter (Signed)
Message copied by Maryan Puls on Thu Feb 25, 2013 11:16 AM ------      Message from: [facility]      Created: Wed Feb 24, 2013 10:51 AM      Contact: [phone]       PLEASE CALL HER SHE HAS SOME PROBLEMS WITH HER WORK NOTE ------

## 2013-02-25 NOTE — Telephone Encounter (Signed)
Called patient to advise that her RTW date has been changed okay per Will, PA.  Patient requested to fax RTW note to Attn: Luisa Hart @ [phone].  Patient also requested a copy to be mailed to her home address.  Faxed confirmation rec'd and attached to patient's RTW copy.

## 2013-03-02 ENCOUNTER — Encounter (INDEPENDENT_AMBULATORY_CARE_PROVIDER_SITE_OTHER): Payer: Self-pay | Admitting: *Deleted

## 2013-03-02 ENCOUNTER — Encounter (INDEPENDENT_AMBULATORY_CARE_PROVIDER_SITE_OTHER): Payer: Self-pay | Admitting: Internal Medicine

## 2013-03-02 ENCOUNTER — Ambulatory Visit (INDEPENDENT_AMBULATORY_CARE_PROVIDER_SITE_OTHER): Payer: Medicaid Other | Admitting: Internal Medicine

## 2013-03-02 VITALS — BP 118/78 | HR 88 | Temp 98.1°F | Resp 14 | Ht 60.0 in | Wt 162.0 lb

## 2013-03-02 DIAGNOSIS — K3580 Unspecified acute appendicitis: Secondary | ICD-10-CM

## 2013-03-02 NOTE — Patient Instructions (Signed)
May resume regular activity without restrictions. Follow up as needed. Call with questions or concerns.  

## 2013-03-02 NOTE — Progress Notes (Signed)
  Subjective: Pt returns to the clinic today after undergoing laparoscopic appendectomy on 02/15/13 by Dr. Johna Sheriff.  The patient is tolerating their diet well and is having no severe pain.  Bowel function is good.  No problems with the wounds.  Objective: Vital signs in last 24 hours: Reviewed  PE: Abd: soft, non-tender, +bs, incisions well healed  Lab Results:  No results found for this basename: WBC, HGB, HCT, PLT,  in the last 72 hours BMET No results found for this basename: NA, K, CL, CO2, GLUCOSE, BUN, CREATININE, CALCIUM,  in the last 72 hours PT/INR No results found for this basename: LABPROT, INR,  in the last 72 hours CMP     Component Value Date/Time   NA 138 02/18/2013 0413   K 3.3* 02/18/2013 0413   CL 105 02/18/2013 0413   CO2 25 02/18/2013 0413   GLUCOSE 122* 02/18/2013 0413   BUN 4* 02/18/2013 0413   CREATININE 0.81 02/18/2013 0413   CALCIUM 8.6 02/18/2013 0413   PROT 5.5* 02/18/2013 0413   ALBUMIN 2.6* 02/18/2013 0413   AST 18 02/18/2013 0413   ALT 12 02/18/2013 0413   ALKPHOS 42 02/18/2013 0413   BILITOT 0.1* 02/18/2013 0413   GFRNONAA 90* 02/18/2013 0413   GFRAA >90 02/18/2013 0413   Lipase     Component Value Date/Time   LIPASE 23 02/14/2013 1817       Studies/Results: No results found.  Anti-infectives: Anti-infectives   None       Assessment/Plan  1.  S/P Laparoscopic Appendectomy: doing well, may resume regular activity without restrictions, Pt will follow up with [place] PR[facility] and knows to call with questions or concerns.     WHITE, ELIZABETH 03/02/2013

## 2014-08-11 ENCOUNTER — Encounter (HOSPITAL_BASED_OUTPATIENT_CLINIC_OR_DEPARTMENT_OTHER): Payer: Self-pay | Admitting: *Deleted

## 2014-08-11 ENCOUNTER — Emergency Department (HOSPITAL_BASED_OUTPATIENT_CLINIC_OR_DEPARTMENT_OTHER)
Admission: EM | Admit: 2014-08-11 | Discharge: 2014-08-11 | Disposition: A | Discharge status: Home / Self Care | Payer: No Typology Code available for payment source | Attending: Emergency Medicine | Admitting: Emergency Medicine

## 2014-08-11 DIAGNOSIS — Z880 Allergy status to penicillin: Secondary | ICD-10-CM | POA: Insufficient documentation

## 2014-08-11 DIAGNOSIS — Z79899 Other long term (current) drug therapy: Secondary | ICD-10-CM | POA: Diagnosis not present

## 2014-08-11 DIAGNOSIS — Z720 Tobacco use: Secondary | ICD-10-CM | POA: Diagnosis not present

## 2014-08-11 DIAGNOSIS — S3992XA Unspecified injury of lower back, initial encounter: Secondary | ICD-10-CM | POA: Diagnosis present

## 2014-08-11 DIAGNOSIS — Y9389 Activity, other specified: Secondary | ICD-10-CM | POA: Insufficient documentation

## 2014-08-11 DIAGNOSIS — S79912A Unspecified injury of left hip, initial encounter: Secondary | ICD-10-CM | POA: Diagnosis not present

## 2014-08-11 DIAGNOSIS — Y92410 Unspecified street and highway as the place of occurrence of the external cause: Secondary | ICD-10-CM | POA: Diagnosis not present

## 2014-08-11 DIAGNOSIS — Y998 Other external cause status: Secondary | ICD-10-CM | POA: Diagnosis not present

## 2014-08-11 DIAGNOSIS — S161XXA Strain of muscle, fascia and tendon at neck level, initial encounter: Secondary | ICD-10-CM

## 2014-08-11 DIAGNOSIS — R000 Tachycardia, unspecified: Secondary | ICD-10-CM | POA: Insufficient documentation

## 2014-08-11 DIAGNOSIS — S39012A Strain of muscle, fascia and tendon of lower back, initial encounter: Secondary | ICD-10-CM

## 2014-08-11 DIAGNOSIS — J45909 Unspecified asthma, uncomplicated: Secondary | ICD-10-CM | POA: Diagnosis not present

## 2014-08-11 DIAGNOSIS — S79911A Unspecified injury of right hip, initial encounter: Secondary | ICD-10-CM | POA: Diagnosis not present

## 2014-08-11 DIAGNOSIS — Z8719 Personal history of other diseases of the digestive system: Secondary | ICD-10-CM | POA: Insufficient documentation

## 2014-08-11 DIAGNOSIS — T148XXA Other injury of unspecified body region, initial encounter: Secondary | ICD-10-CM

## 2014-08-11 DIAGNOSIS — S4990XA Unspecified injury of shoulder and upper arm, unspecified arm, initial encounter: Secondary | ICD-10-CM | POA: Insufficient documentation

## 2014-08-11 MED ORDER — NAPROXEN 500 MG PO TABS: 500.0000 mg | ORAL_TABLET | Freq: Two times a day (BID) | ORAL | Status: DC

## 2014-08-11 NOTE — ED Notes (Addendum)
Pt given Rx x 1 for naproxyen. Ice pack given for home use
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
## 2014-11-01 IMAGING — CT CT ABD-PELV W/ CM
1 of 2 series · 15 of 32 positions shown, 19 images · IV contrast (OMNIPAQUE 300)
Comparison: 02/14/2013 ultrasound

CLINICAL DATA: Abdominal pain, emesis

CT ABDOMEN AND PELVIS WITH CONTRAST
TECHNIQUE: Multidetector CT imaging of the abdomen and pelvis was
performed following the standard protocol during bolus
administration of intravenous contrast.
Contrast: 50mL OMNIPAQUE IOHEXOL 300 MG/ML  SOLN, 100mL OMNIPAQUE
IOHEXOL 300 MG/ML  SOLN

[Series 2: abd/pel with · axial · 0.65mm/px · z∈[-380,-5]mm · 15 of 83 slices shown, 19 images]
[im 4/83  soft-tissue]
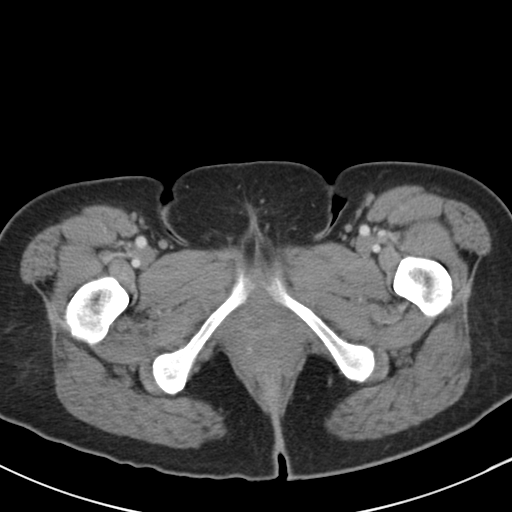
[im 4/83  bone]
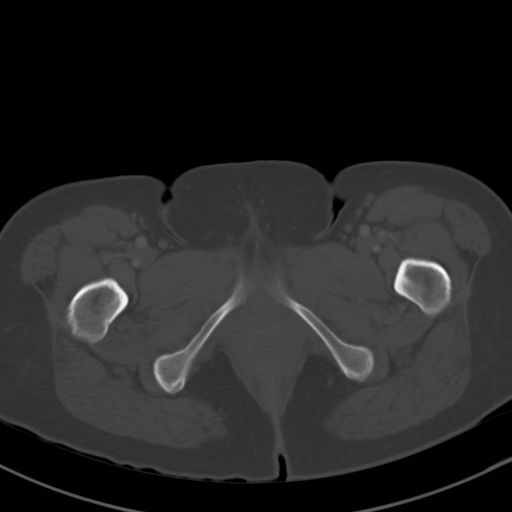
[im 11/83  soft-tissue]
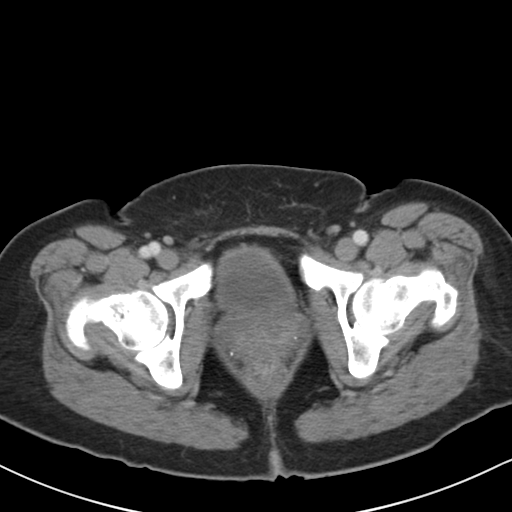
[im 18/83  soft-tissue]
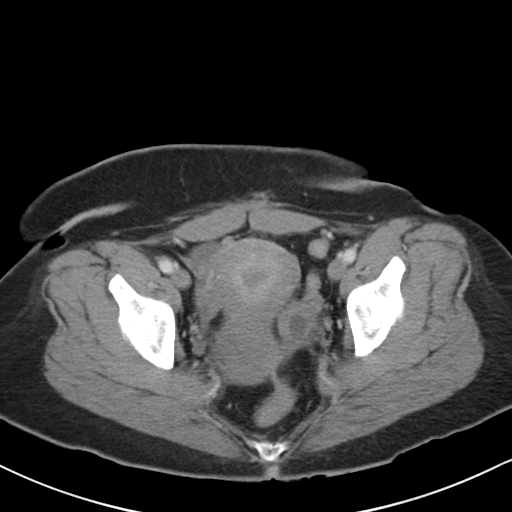
[im 22/83  soft-tissue]
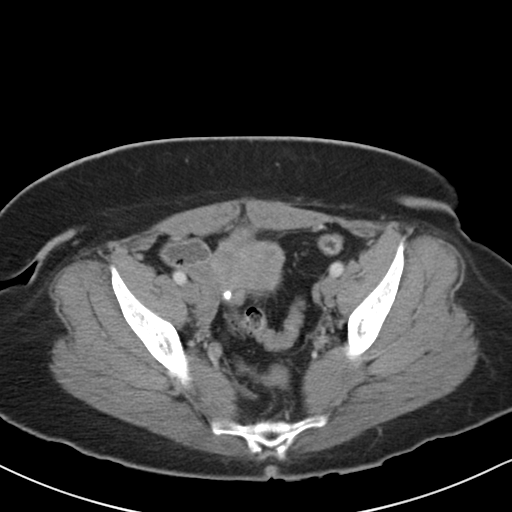
[im 29/83  soft-tissue]
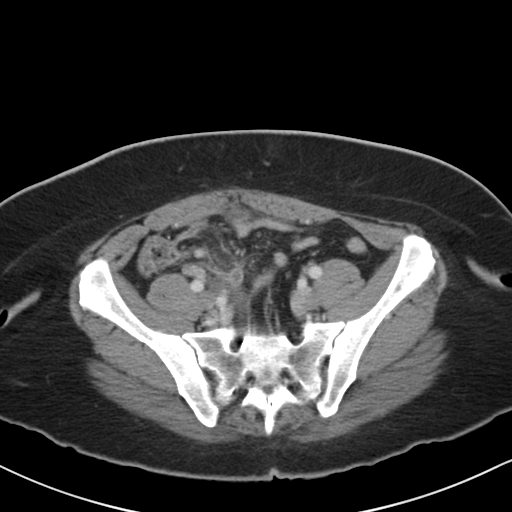
[im 36/83  soft-tissue]
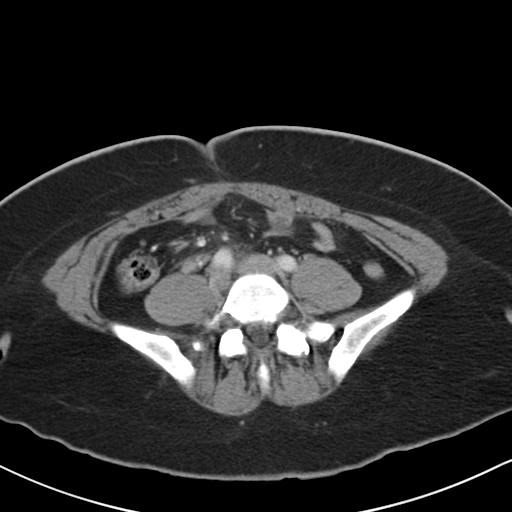
[im 43/83  soft-tissue]
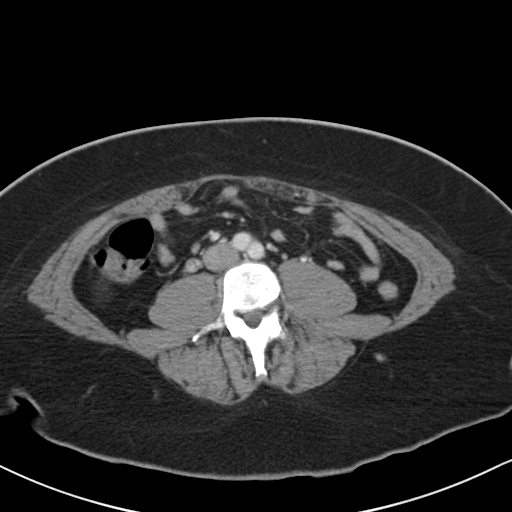
[im 47/83  soft-tissue]
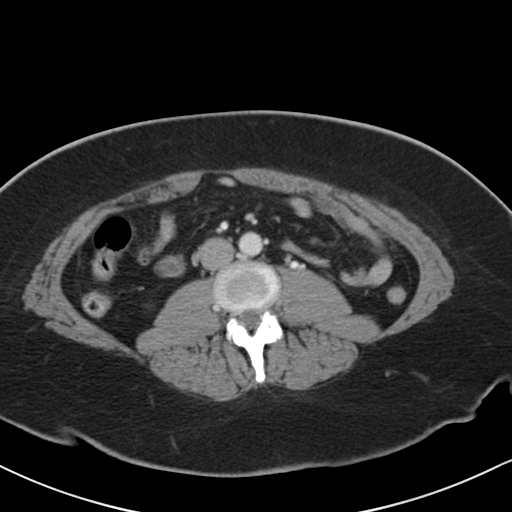
[im 54/83  soft-tissue]
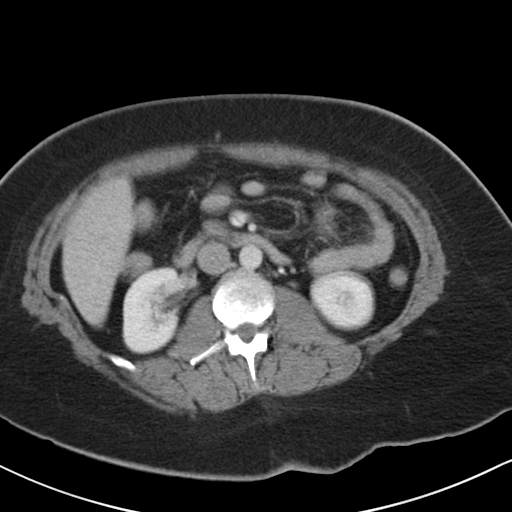
[im 54/83  bone]
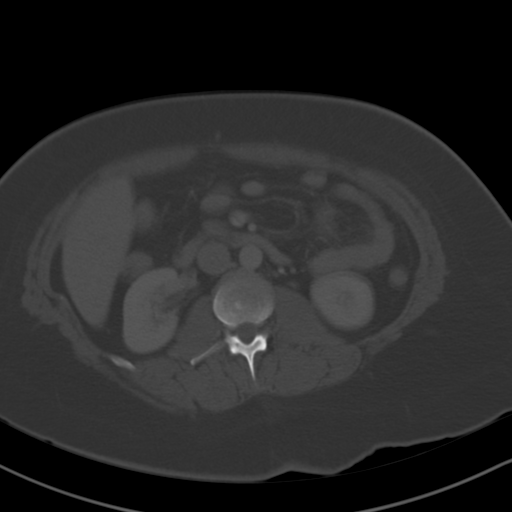
[im 61/83  soft-tissue]
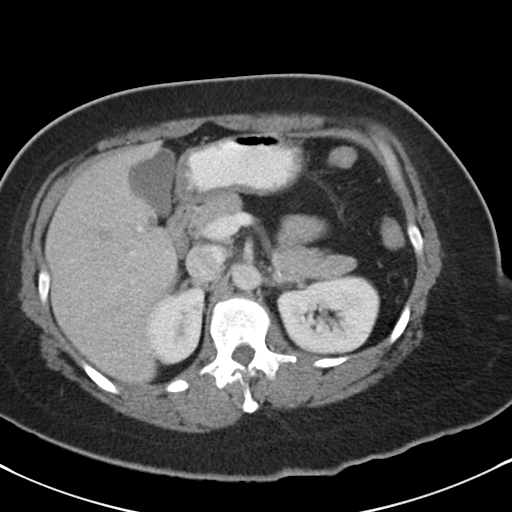
[im 65/83  soft-tissue]
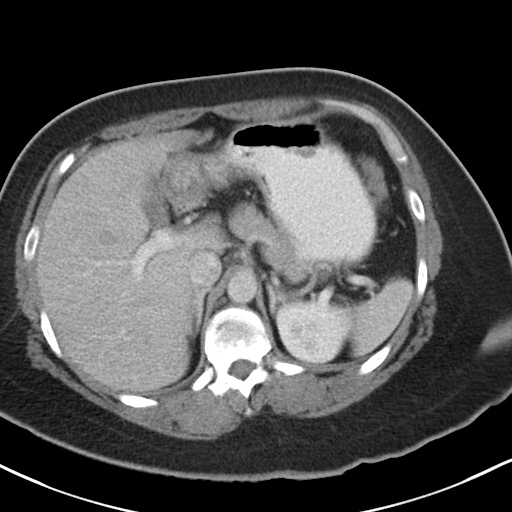
[im 68/83  lung]
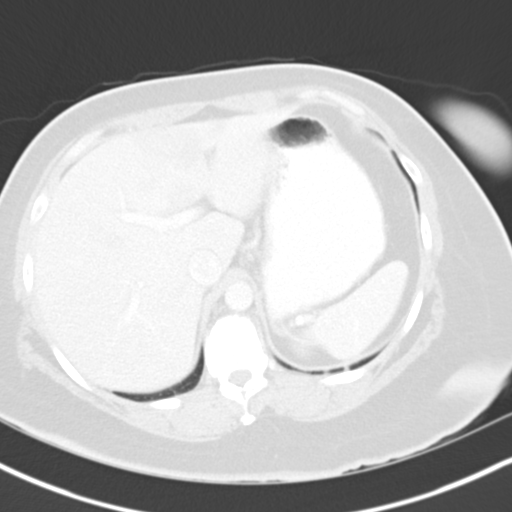
[im 72/83  soft-tissue]
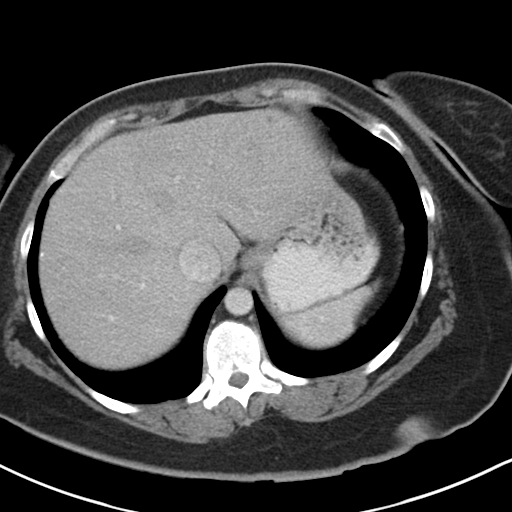
[im 72/83  lung]
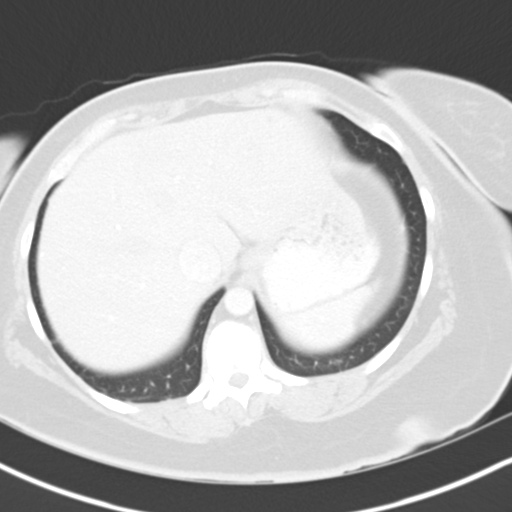
[im 75/83  lung]
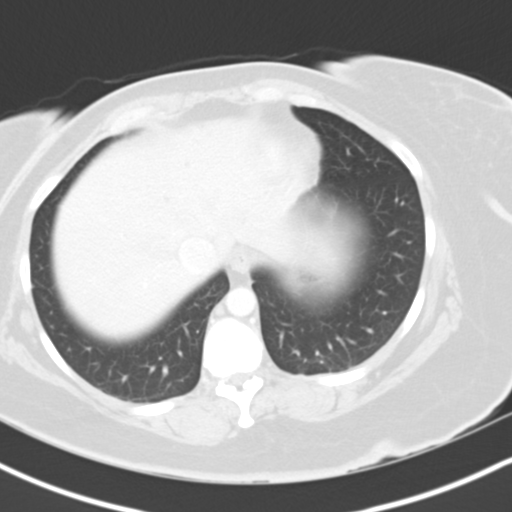
[im 79/83  soft-tissue]
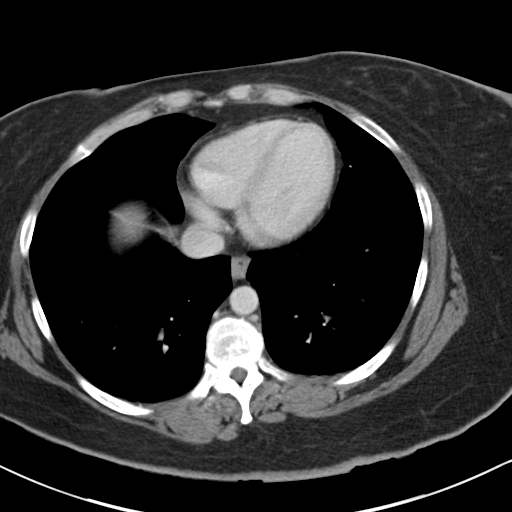
[im 79/83  lung]
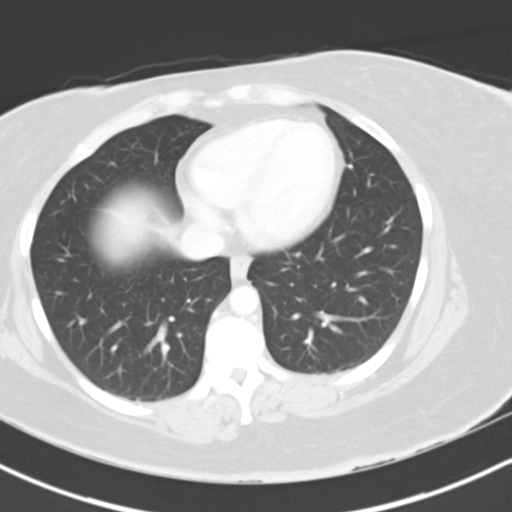

[15 of 32 positions shown; findings below may reference images not displayed]

FINDINGS: Lung bases clear.  Normal heart size.  No pericardial or
pleural effusion.  No hiatal hernia.

Abdomen:  Liver demonstrates scattered sub centimeter hypodensities
in the dome, too small to definitively characterize but suspect
small hepatic cysts.  Liver is scanned in the early portal phase.
Therefore the hepatic veins are not well opacified.  Left hepatic
lobe medial segment demonstrates subcapsular hypoattenuation,
suspect geographic fatty infiltration.  Gallbladder, biliary
system, pancreas, spleen, adrenal glands, and kidneys demonstrate
no acute process and are within normal limits for age.

Negative for bowel obstruction, dilatation, ileus, or free air.

In the right lower quadrant, the appendix is fluid distended with
enhancement and surrounding inflammation compatible with acute
appendicitis, non ruptured.

Pelvis:  Tubal ligation clips noted.  Uterus normal in size.  Small
dominant follicle measures 14 mm on the left ovary.  Small amount
pelvic free fluid dependently.  No other acute distal bowel process
involving the colon.  Colon is collapsed.  No pelvic hemorrhage,
abscess, adenopathy, inguinal abnormality, or hernia.

Mild degenerative changes of the spine.  No acute osseous finding.
IMPRESSION: Acute non ruptured appendicitis.

Hepatic sub-centimeter hypodensities, too small to definitively
characterize but suspect small cysts

Triangular subcapsular hypodense area in the left hepatic lobe
medial segment, suspect geographic fatty infiltration

Prior tubal ligation

14 mm dominant left ovarian follicle

Trace pelvic free fluid

Critical Value/emergent results were called by telephone at the
time of interpretation on 02/14/2013 at [DATE] p.m. to Dr. Dagialex,
who verbally acknowledged these results.

## 2015-07-31 ENCOUNTER — Ambulatory Visit (INDEPENDENT_AMBULATORY_CARE_PROVIDER_SITE_OTHER): Payer: BLUE CROSS/BLUE SHIELD | Admitting: Podiatry

## 2015-07-31 ENCOUNTER — Encounter: Payer: Self-pay | Admitting: Podiatry

## 2015-07-31 ENCOUNTER — Ambulatory Visit (INDEPENDENT_AMBULATORY_CARE_PROVIDER_SITE_OTHER): Payer: BLUE CROSS/BLUE SHIELD

## 2015-07-31 VITALS — BP 128/90 | HR 86 | Resp 16 | Ht 61.0 in | Wt 145.0 lb

## 2015-07-31 DIAGNOSIS — M79673 Pain in unspecified foot: Secondary | ICD-10-CM

## 2015-07-31 DIAGNOSIS — M21619 Bunion of unspecified foot: Secondary | ICD-10-CM | POA: Diagnosis not present

## 2015-07-31 DIAGNOSIS — M779 Enthesopathy, unspecified: Secondary | ICD-10-CM | POA: Diagnosis not present

## 2015-07-31 MED ORDER — TRIAMCINOLONE ACETONIDE 10 MG/ML IJ SUSP
10.0000 mg | Freq: Once | INTRAMUSCULAR | Status: AC
  Administered 2015-07-31: 10 mg

## 2015-07-31 NOTE — Progress Notes (Signed)
   Subjective:    Patient ID: Stephanie Mcgrath, female    DOB: 02/24/1971, 45 y.o.   MRN: [id number]  HPI Pt presents with foot pain in their left foot; medial side; interdigital 1st & 2nd toe; swelling; x8 months.  Pt had surgery approximately 6 years ago on their left foot for a spur at the base of their great toe.   Review of Systems  All other systems reviewed and are negative.      Objective:   Physical Exam        Assessment & Plan:

## 2015-08-02 NOTE — Progress Notes (Signed)
Subjective:     Patient ID: Stephanie Mcgrath, female   DOB: 10-18-1970, 45 y.o.   MRN: [id number]  HPI patient presents stating that she's been having trouble with the left ankle for around 8 months and it's been getting gradually more intense. She walks a lot and can develop pain fairly easily   Review of Systems  All other systems reviewed and are negative.      Objective:   Physical Exam  Constitutional: She is oriented to person, place, and time.  Cardiovascular: Intact distal pulses.   Musculoskeletal: Normal range of motion.  Neurological: She is oriented to person, place, and time.  Skin: Skin is warm.  Nursing note and vitals reviewed.  neurovascular status found to be intact with muscle strength adequate range of motion within normal limits and no indication of posterior tibial dysfunction left over right. Patient has pinpoint tenderness of the posterior tibial tendon as it inserts into the navicular with fluid buildup and moderate depression of the arch upon weightbearing. Digital perfusion and is well oriented 3     Assessment:     Inflammatory tendinitis posterior tibial tendon left    Plan:     H&P and x-rays reviewed with patient and today careful sheath injection administered posterior tibial tendon 3 mg [place] some Kenalog 5 mill grams Xylocaine with fascial brace application and reduced activity recommended. Discussed possible immobilization the future and patient will make good long-term orthotic candidate

## 2015-08-07 ENCOUNTER — Encounter: Payer: Self-pay | Admitting: Podiatry

## 2015-08-07 ENCOUNTER — Ambulatory Visit (INDEPENDENT_AMBULATORY_CARE_PROVIDER_SITE_OTHER): Payer: BLUE CROSS/BLUE SHIELD | Admitting: Podiatry

## 2015-08-07 VITALS — BP 114/75 | HR 86 | Resp 16

## 2015-08-07 DIAGNOSIS — M779 Enthesopathy, unspecified: Secondary | ICD-10-CM | POA: Diagnosis not present

## 2015-08-08 NOTE — Progress Notes (Signed)
Subjective:     Patient ID: Stephanie Mcgrath, female   DOB: 03/04/1971, 45 y.o.   MRN: [id number]  HPI patient states my left foot is still really hurting and the brace has just not helped me and I feel like I have trouble walking on my foot and ankle   Review of Systems     Objective:   Physical Exam  neurovascular status intact muscle strength adequate with continued discomfort around posterior tibial tendon left with moderate depression of the arch noted but what appears to be function of the tendon    Assessment:      posterior tibial tendinitis left still present but no indication of tear    Plan:      explained condition and at this time applied air fracture walker to completely immobilize and rest posterior tibial tendon and scanned for custom orthotic devices. Reappoint when returned and if symptoms were persist may need to consider MRI to rule out an interstitial issue with this tendon

## 2015-08-28 ENCOUNTER — Ambulatory Visit (INDEPENDENT_AMBULATORY_CARE_PROVIDER_SITE_OTHER): Payer: BLUE CROSS/BLUE SHIELD | Admitting: Podiatry

## 2015-08-28 DIAGNOSIS — M779 Enthesopathy, unspecified: Secondary | ICD-10-CM

## 2015-08-28 NOTE — Patient Instructions (Signed)

## 2015-08-29 ENCOUNTER — Telehealth: Payer: Self-pay | Admitting: *Deleted

## 2015-08-29 MED ORDER — NONFORMULARY OR COMPOUNDED ITEM: Status: AC

## 2015-08-29 NOTE — Progress Notes (Signed)
Subjective:     Patient ID: Stephanie Mcgrath, female   DOB: 1970/09/28, 45 y.o.   MRN: [id number]  HPI patient presents stating I'm still having pain with this left foot and it seems like the boot while wearing it is better but when I don't it seems to still be discomforting   Review of Systems     Objective:   Physical Exam  mentioned below    Assessment:      neurovascular status unchanged with continued discomfort in the plantar medial arch and posterior tibial tendon complex left with inflammation noted but no acute fluid buildup noted. Moderate depression of the arch is note     Plan:      above assessment and I do believe we are dealing with tendinitis-like symptoms which are failing to respond. At this point I did recommend continued boot usage I am sending for a compounding creams to try to reduce plantar inflammation and medial inflammation and orthotics will gradually be used. May require MRI or other treatment to try to evaluate condition if symptoms persist

## 2015-08-29 NOTE — Telephone Encounter (Signed)
Dr. Charlsie Merles ordered [facility] Antiinflammatory cream.  Faxed to [facility].

## 2015-09-25 ENCOUNTER — Encounter: Payer: Self-pay | Admitting: Podiatry

## 2015-09-25 ENCOUNTER — Ambulatory Visit (INDEPENDENT_AMBULATORY_CARE_PROVIDER_SITE_OTHER): Payer: BLUE CROSS/BLUE SHIELD | Admitting: Podiatry

## 2015-09-25 DIAGNOSIS — M6789 Other specified disorders of synovium and tendon, multiple sites: Secondary | ICD-10-CM

## 2015-09-25 DIAGNOSIS — M779 Enthesopathy, unspecified: Secondary | ICD-10-CM | POA: Diagnosis not present

## 2015-09-25 DIAGNOSIS — M76829 Posterior tibial tendinitis, unspecified leg: Secondary | ICD-10-CM

## 2015-09-26 ENCOUNTER — Telehealth: Payer: Self-pay | Admitting: *Deleted

## 2015-09-26 NOTE — Telephone Encounter (Signed)
Dr. Charlsie Merlesegal ordered MRI of left foot.  [employer] [place] [facility] STATES NO PRIOR AUTHORIZATION IS REQUIRED, REFERENCE #[id number]. [facility].

## 2015-09-27 NOTE — Progress Notes (Signed)
Subjective:     Patient ID: Stephanie Mcgrath, female   DOB: 06/11/1971, 45 y.o.   MRN: [id number]  HPI patient presents she seems to be doing somewhat better with her boot on but she continues to have pain if she walks without it or if she is not wearing orthotics   Review of Systems     Objective:   Physical Exam Neurovascular status with continued discomfort in the medial side of the posterior tibial tendon as it inserts into the navicular with mild discomfort in the medial fascial band and moderate depression of the arch    Assessment:     Continue tendinitis with probability for posterior tibial tendon dysfunction    Plan:     Reviewed condition and we'll try for gradual reduction of immobilization over the next 4 weeks and if symptoms were to persist work and the need to consider MRI for this particular problem. Reappoint for us to recheck again in 4 weeks

## 2015-10-01 ENCOUNTER — Inpatient Hospital Stay: Admission: RE | Admit: 2015-10-01 | Payer: Medicaid Other | Source: Ambulatory Visit

## 2016-04-24 ENCOUNTER — Emergency Department (HOSPITAL_COMMUNITY): Payer: BLUE CROSS/BLUE SHIELD

## 2016-04-24 ENCOUNTER — Emergency Department (HOSPITAL_COMMUNITY)
Admission: EM | Admit: 2016-04-24 | Discharge: 2016-04-24 | Disposition: A | Discharge status: Home / Self Care | Payer: BLUE CROSS/BLUE SHIELD | Attending: Emergency Medicine | Admitting: Emergency Medicine

## 2016-04-24 ENCOUNTER — Encounter (HOSPITAL_COMMUNITY): Payer: Self-pay | Admitting: *Deleted

## 2016-04-24 DIAGNOSIS — F17200 Nicotine dependence, unspecified, uncomplicated: Secondary | ICD-10-CM | POA: Diagnosis not present

## 2016-04-24 DIAGNOSIS — M25512 Pain in left shoulder: Secondary | ICD-10-CM | POA: Diagnosis present

## 2016-04-24 DIAGNOSIS — Y929 Unspecified place or not applicable: Secondary | ICD-10-CM | POA: Insufficient documentation

## 2016-04-24 DIAGNOSIS — X58XXXA Exposure to other specified factors, initial encounter: Secondary | ICD-10-CM | POA: Diagnosis not present

## 2016-04-24 DIAGNOSIS — J45909 Unspecified asthma, uncomplicated: Secondary | ICD-10-CM | POA: Diagnosis not present

## 2016-04-24 DIAGNOSIS — Y939 Activity, unspecified: Secondary | ICD-10-CM | POA: Insufficient documentation

## 2016-04-24 DIAGNOSIS — Y990 Civilian activity done for income or pay: Secondary | ICD-10-CM | POA: Insufficient documentation

## 2016-04-24 MED ORDER — NAPROXEN 500 MG PO TABS: 500.0000 mg | ORAL_TABLET | Freq: Two times a day (BID) | ORAL | 0 refills | Status: DC

## 2016-04-24 MED ORDER — IBUPROFEN 800 MG PO TABS
800.0000 mg | ORAL_TABLET | Freq: Once | ORAL | Status: AC
  Administered 2016-04-24: 800 mg via ORAL
  Filled 2016-04-24: qty 1

## 2016-04-24 NOTE — Progress Notes (Addendum)
Pt states she woke up with left shoulder pain and is not able to extend her arm. Pt denies any injury.Ice pack applied. (6:20pm)Pt returned from x-ray. (7:25pm) Report to oncoming shift. (7:30pm)

## 2016-04-24 NOTE — Discharge Instructions (Signed)
Medications: Naprosyn  Treatment: Take Naprosyn twice daily for 1 week. Do not take this medication with ibuprofen. You can alternate Tylenol every 4-6 hours on top of Naprosyn. Wear your sling for comfort. Attempt the shoulder range of motion exercises as tolerated daily. Use ice 3-4 times daily alternating 20 minutes on, 20 minutes off.  Follow-up: Please follow-up with Dr. Turner Danielsowan, an orthopedic doctor, for further evaluation and treatment of your symptoms. Please return to emergency department if you develop any new or worsening symptoms.

## 2016-04-24 NOTE — ED Triage Notes (Signed)
Pt complains of throbbing pain in her right shoulder radiating to her elbow for the past 5 hours that started while she was getting dressed. Pt states pain is worse with movement. Pt denies chest pain, shortness of breath, dizziness. Pt denies trauma to arm.

## 2016-04-24 NOTE — ED Provider Notes (Signed)
[facility]-EMERGENCY DEPT Provider Note   CSN: [id number] Arrival date & time: 04/24/16  1704  By signing my name below, I, Placido Sou, attest that this documentation has been prepared under the direction and in the presence of [employer], PA-C.  Electronically Signed: Placido Sou, ED Scribe. 04/24/16. 6:01 PM.    History   Chief Complaint Chief Complaint  Patient presents with  . Shoulder Pain    HPI HPI Comments: Stephanie Mcgrath is a 45 y.o. female who presents to the Emergency Department complaining of constant, 10/10, throbbing, atraumatic, anterior left shoulder pain x 5 hours. Her pain worsens with movement and palpation and radiates to her left elbow. Pt reports numbness and tingling in her right hand but states she has been dx with carpal tunnel and denies her symptoms have acutely worsened. Pt took a muscle relaxer and ibuprofen two hours ago w/o significant relief. She denies a h/o kidney or gastric issues. 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 She denies neck pain, CP, SOB, abdominal pain, nausea, vomiting or other associated symptoms at this time. Patient denies any trauma, however she is very concerned that it is broken.  The history is provided by the patient. No language interpreter was used.    Past Medical History:  Diagnosis Date  . Appendicitis, acute, with peritonitis 02/17/2013  . Asthma   . BMI 28.0-28.9,adult 02/17/2013  . Unspecified asthma(493.90) 02/17/2013    Patient Active Problem List   Diagnosis Date Noted  . Appendicitis, acute, with peritonitis 02/17/2013  . Unspecified asthma(493.90) 02/17/2013  . BMI 28.0-28.9,adult 02/17/2013    Past Surgical History:  Procedure Laterality Date  . APPENDECTOMY    . CESAREAN SECTION    . FOOT SURGERY     left  . LAPAROSCOPIC APPENDECTOMY N/A 02/14/2013   Procedure: APPENDECTOMY LAPAROSCOPIC;  Surgeon: Mariella Saa, MD;  Location: [facility];  Service: General;   Laterality: N/A;  . TUBAL LIGATION      OB History    No data available       Home Medications    Prior to Admission medications   Medication Sig Start Date End Date Taking? Authorizing Provider  albuterol (PROVENTIL HFA;VENTOLIN HFA) 108 (90 BASE) MCG/ACT inhaler Inhale 2 puffs into the lungs every 4 (four) hours as needed. For tightness     Historical Provider, MD  albuterol (PROVENTIL) (5 MG/ML) 0.5% nebulizer solution Take 2.5 mg by nebulization every 4 (four) hours as needed.     Historical Provider, MD  cyclobenzaprine (FLEXERIL) 10 MG tablet Take 10 mg by mouth as needed.  06/28/15   Historical Provider, MD  ibuprofen (ADVIL,MOTRIN) 200 MG tablet You can take 2-3 tablets every 6 hours for pain. 02/17/13   Sherrie George, PA-C  montelukast (SINGULAIR) 10 MG tablet Take 10 mg by mouth as needed.  03/15/14   Historical Provider, MD  naproxen (NAPROSYN) 500 MG tablet Take 1 tablet (500 mg total) by mouth 2 (two) times daily. 04/24/16   Emi Holes, PA-C  NONFORMULARY OR COMPOUNDED ITEM [facility] Compound:  Antiinflammatory Cream - Diclofenac 3%, Baclofen 2%, Cyclobenzaprine 2%, Lidocaine 2%, dispense 180gm, apply 1-2 grams to affected area 3-4 times daily, +3refills. 08/29/15   Lenn Sink, DPM  phentermine (ADIPEX-P) 37.5 MG tablet Take 37.5 mg by mouth daily before breakfast.    Historical Provider, MD    Family History No family history on file.  Social History Social History  Substance Use  Topics  . Smoking status: Current Some Day Smoker  . Smokeless tobacco: Never Used  . Alcohol use Yes     Allergies   Penicillins   Review of Systems Review of Systems  Respiratory: Negative for shortness of breath.   Cardiovascular: Negative for chest pain.  Gastrointestinal: Negative for abdominal pain, nausea and vomiting.  Musculoskeletal: Positive for arthralgias and myalgias. Negative for joint swelling and neck pain.  Skin: Negative for color change and wound.   Neurological: Positive for numbness (chronic).   Physical Exam Updated Vital Signs BP 121/85 (BP Location: Right Arm)   Pulse 87   Temp 98.5 F (36.9 C) (Oral)   Resp 18   LMP 04/17/2016   SpO2 100%   Physical Exam  Constitutional: She appears well-developed and well-nourished. No distress.  HENT:  Head: Normocephalic and atraumatic.  Mouth/Throat: Oropharynx is clear and moist. No oropharyngeal exudate.  Eyes: Conjunctivae are normal. Pupils are equal, round, and reactive to light. Right eye exhibits no discharge. Left eye exhibits no discharge. No scleral icterus.  Neck: Normal range of motion. Neck supple. No thyromegaly present.  Cardiovascular: Normal rate, regular rhythm, normal heart sounds and intact distal pulses.  Exam reveals no gallop and no friction rub.   No murmur heard. Pulmonary/Chest: Effort normal and breath sounds normal. No stridor. No respiratory distress. She has no wheezes. She has no rales.  Abdominal: Soft. Bowel sounds are normal. She exhibits no distension. There is no tenderness. There is no rebound and no guarding.  Musculoskeletal: She exhibits no edema.       Left shoulder: She exhibits decreased range of motion, tenderness and decreased strength.       Arms: Anterior tenderness over the bicipital groove; decreased range of motion due to pain or lack of effort; any movements a resting position in internal rotation seemed to constipation pain; normal sensation; no specific bony tenderness about the elbow; radial pulses intact  Lymphadenopathy:    She has no cervical adenopathy.  Neurological: She is alert. Coordination normal.  Skin: Skin is warm and dry. No rash noted. She is not diaphoretic. No pallor.  Psychiatric: She has a normal mood and affect.  Nursing note and vitals reviewed.  ED Treatments / Results  Labs (all labs ordered are listed, but only abnormal results are displayed) Labs Reviewed - No data to display  EKG  EKG  Interpretation None       Radiology Dg Shoulder Left  Result Date: 04/24/2016 CLINICAL DATA:  Generalized shoulder pain, no known injury, initial encounter EXAM: LEFT SHOULDER - 2+ VIEW COMPARISON:  None. FINDINGS: Mild degenerative changes of the acromioclavicular joint are seen. No fracture or dislocation is seen. No soft tissue abnormality is noted. IMPRESSION: No acute abnormality noted. Electronically Signed   By: Alcide Clever M.D.   On: 04/24/2016 19:31    Procedures Procedures  DIAGNOSTIC STUDIES: Oxygen Saturation is 100% on RA, normal by my interpretation.    COORDINATION OF CARE: 5:58 PM Discussed next steps with pt. Pt verbalized understanding and is agreeable with the plan.    Medications Ordered in ED Medications  ibuprofen (ADVIL,MOTRIN) tablet 800 mg (800 mg Oral Given 04/24/16 1833)     Initial Impression / Assessment and Plan / ED Course  I have reviewed the triage vital signs and the nursing notes.  Pertinent labs & imaging results that were available during my care of the patient were reviewed by me and considered in my medical decision making (see  chart for details).  Clinical Course    Left shoulder x-ray negative. Suspect biceps tendinitis. Patient placed in sling and given NSAIDs. Supportive treatment discussed including ice and shoulder range of motion exercises. Follow-up with orthopedics. Return precautions discussed. Patient understands and agrees with plan. Patient discussed with Dr. Jeraldine LootsLockwood who guided the patient's management and agrees with plan.  I personally performed the services described in this documentation, which was scribed in my presence. The recorded information has been reviewed and is accurate.  Final Clinical Impressions(s) / ED Diagnoses   Final diagnoses:  Acute pain of left shoulder    New Prescriptions Discharge Medication List as of 04/24/2016  8:11 PM    START taking these medications   Details  naproxen (NAPROSYN)  500 MG tablet Take 1 tablet (500 mg total) by mouth 2 (two) times daily., Starting Wed 04/24/2016, Print         Emi Holeslexandra M Scotty Weigelt, PA-C 04/24/16 2308    Gerhard Munchobert Lockwood, MD 04/24/16 2322

## 2018-01-09 IMAGING — CR DG SHOULDER 2+V*L*
2 series · 2 of 2 positions shown · non-contrast
Comparison: None.

CLINICAL DATA: Generalized shoulder pain, no known injury, initial
encounter

EXAM:
LEFT SHOULDER - 2+ VIEW

[w shoulder external left]
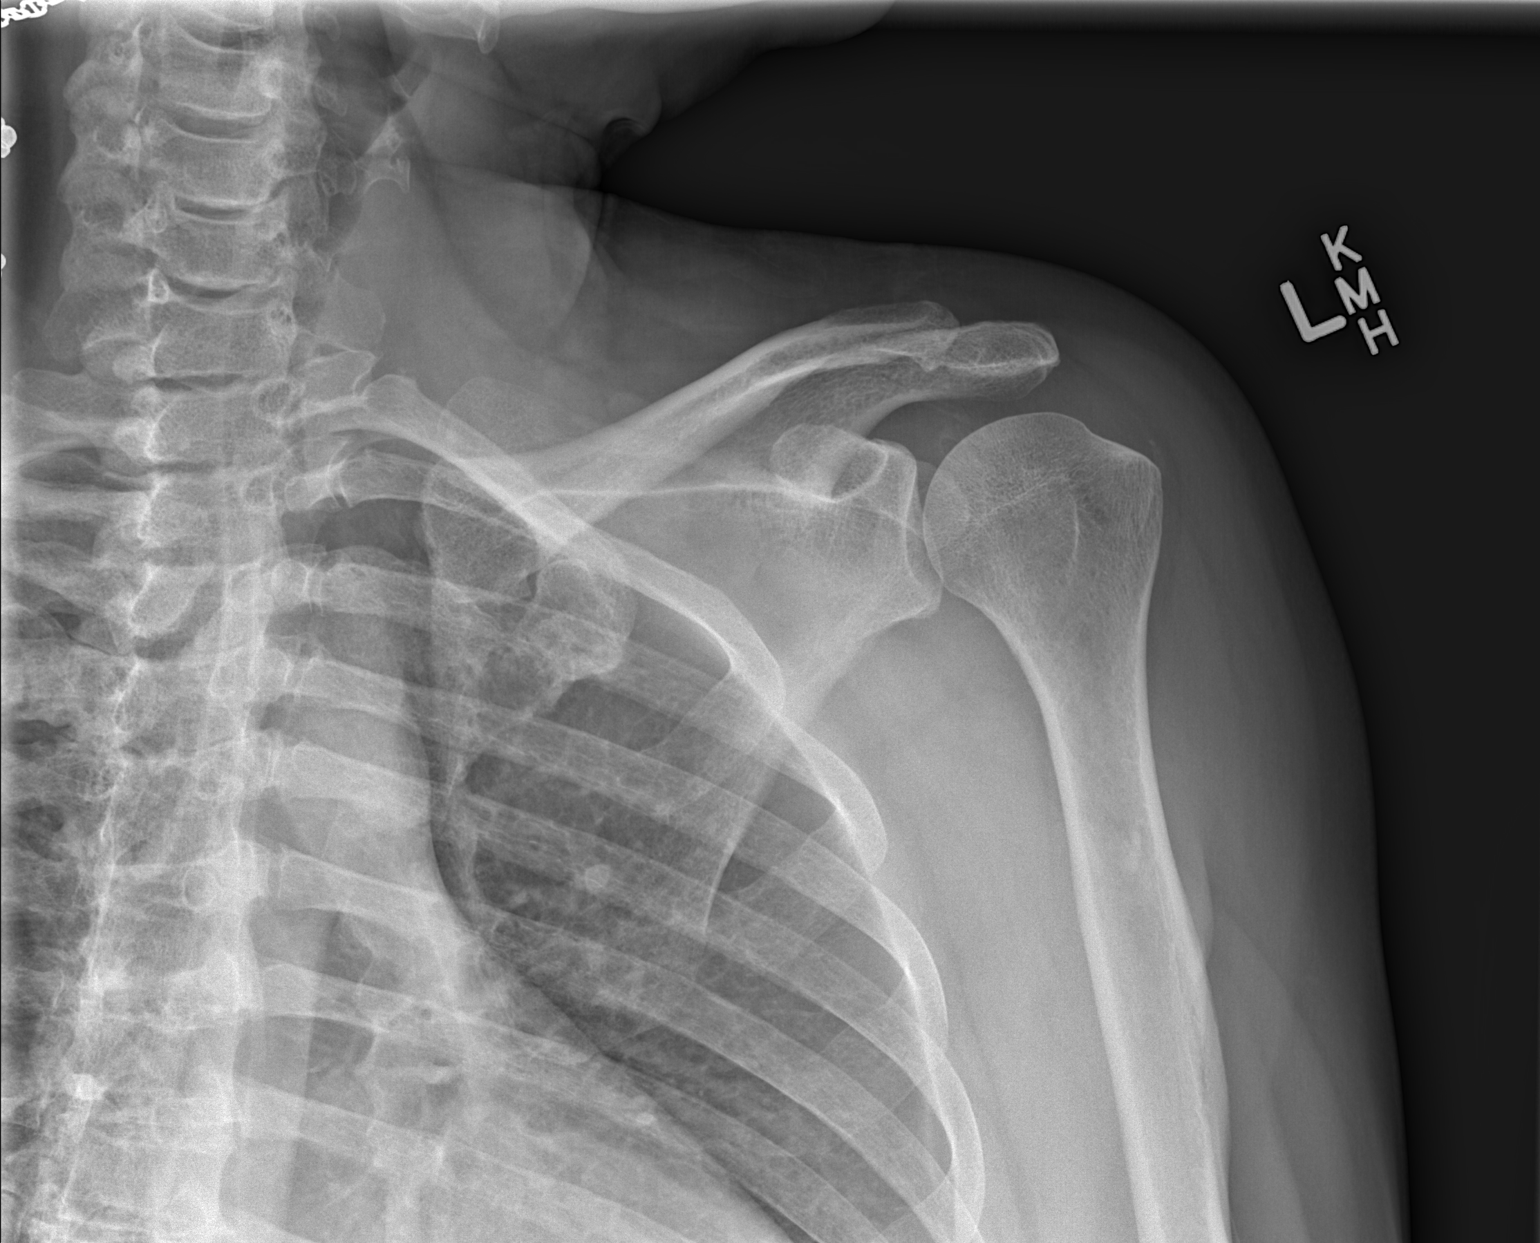

[w shoulder y-view left]
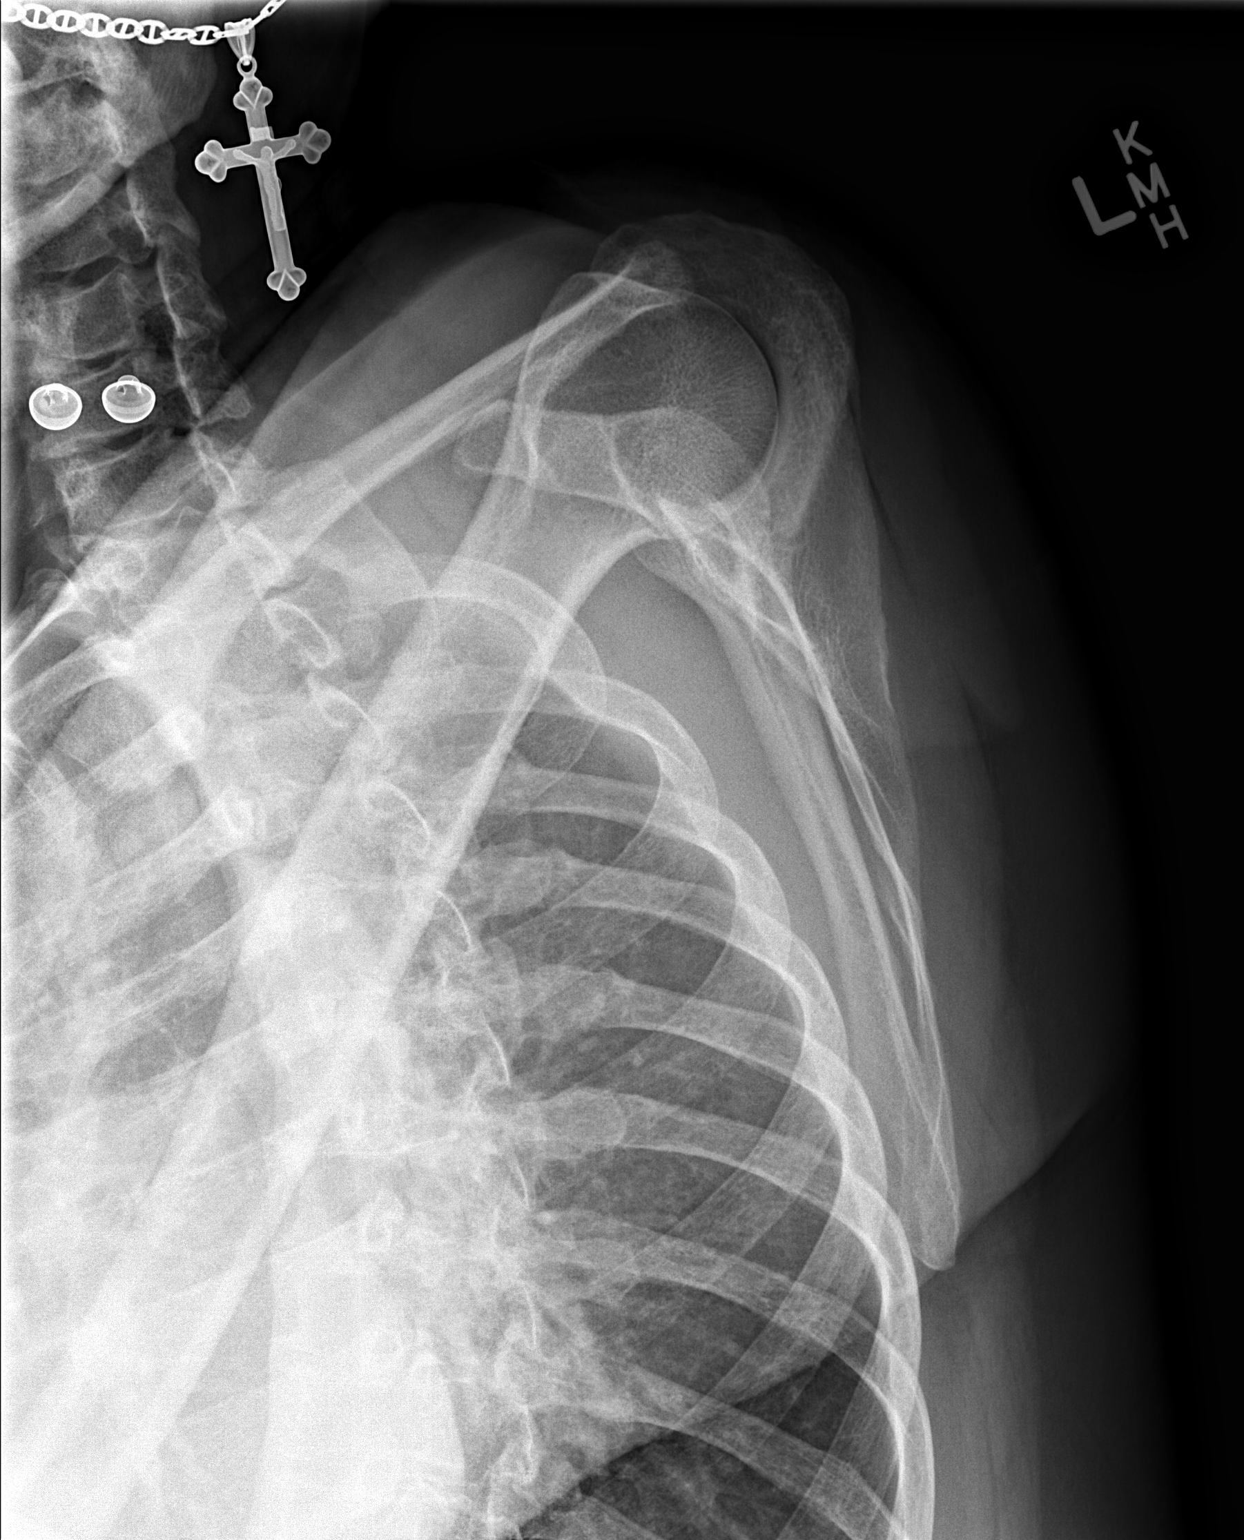

[2 of 2 positions shown; findings below may reference images not displayed]

FINDINGS: Mild degenerative changes of the acromioclavicular joint are seen.
No fracture or dislocation is seen. No soft tissue abnormality is
noted.
IMPRESSION: No acute abnormality noted.

## 2019-05-06 ENCOUNTER — Other Ambulatory Visit: Payer: Self-pay

## 2019-05-06 ENCOUNTER — Encounter (HOSPITAL_BASED_OUTPATIENT_CLINIC_OR_DEPARTMENT_OTHER): Payer: Self-pay

## 2019-05-06 ENCOUNTER — Emergency Department (HOSPITAL_BASED_OUTPATIENT_CLINIC_OR_DEPARTMENT_OTHER)
Admission: EM | Admit: 2019-05-06 | Discharge: 2019-05-06 | Disposition: A | Discharge status: Home / Self Care | Payer: BLUE CROSS/BLUE SHIELD | Attending: Emergency Medicine | Admitting: Emergency Medicine

## 2019-05-06 DIAGNOSIS — Z79899 Other long term (current) drug therapy: Secondary | ICD-10-CM | POA: Diagnosis not present

## 2019-05-06 DIAGNOSIS — Z880 Allergy status to penicillin: Secondary | ICD-10-CM | POA: Insufficient documentation

## 2019-05-06 DIAGNOSIS — F17210 Nicotine dependence, cigarettes, uncomplicated: Secondary | ICD-10-CM | POA: Diagnosis not present

## 2019-05-06 DIAGNOSIS — J45909 Unspecified asthma, uncomplicated: Secondary | ICD-10-CM | POA: Diagnosis not present

## 2019-05-06 DIAGNOSIS — Z20828 Contact with and (suspected) exposure to other viral communicable diseases: Secondary | ICD-10-CM | POA: Diagnosis not present

## 2019-05-06 DIAGNOSIS — R509 Fever, unspecified: Secondary | ICD-10-CM | POA: Insufficient documentation

## 2019-05-06 DIAGNOSIS — R197 Diarrhea, unspecified: Secondary | ICD-10-CM | POA: Diagnosis not present

## 2019-05-06 DIAGNOSIS — J029 Acute pharyngitis, unspecified: Secondary | ICD-10-CM | POA: Diagnosis not present

## 2019-05-06 DIAGNOSIS — Z20822 Contact with and (suspected) exposure to covid-19: Secondary | ICD-10-CM

## 2019-05-06 NOTE — Discharge Instructions (Signed)
Person Under Monitoring Name: Stephanie Mcgrath  Location: [place] [address]   Infection Prevention Recommendations for Individuals Confirmed to have, or Being Evaluated for, 2019 Novel Coronavirus (COVID-19) Infection Who Receive Care at Home  Individuals who are confirmed to have, or are being evaluated for, COVID-19 should follow the prevention steps below until a healthcare provider or local or state health department says they can return to normal activities.  Stay home except to get medical care You should restrict activities outside your home, except for getting medical care. Do not go to work, school, or public areas, and do not use public transportation or taxis.  Call ahead before visiting your doctor Before your medical appointment, call the healthcare provider and tell them that you have, or are being evaluated for, COVID-19 infection. This will help the healthcare providers office take steps to keep other people from getting infected. Ask your healthcare provider to call the local or state health department.  Monitor your symptoms Seek prompt medical attention if your illness is worsening (e.g., difficulty breathing). Before going to your medical appointment, call the healthcare provider and tell them that you have, or are being evaluated for, COVID-19 infection. Ask your healthcare provider to call the local or state health department.  Wear a facemask You should wear a facemask that covers your nose and mouth when you are in the same room with other people and when you visit a healthcare provider. People who live with or visit you should also wear a facemask while they are in the same room with you.  Separate yourself from other people in your home As much as possible, you should stay in a different room from other people in your home. Also, you should use a separate bathroom, if available.  Avoid sharing household items You should  not share dishes, drinking glasses, cups, eating utensils, towels, bedding, or other items with other people in your home. After using these items, you should wash them thoroughly with soap and water.  Cover your coughs and sneezes Cover your mouth and nose with a tissue when you cough or sneeze, or you can cough or sneeze into your sleeve. Throw used tissues in a lined trash can, and immediately wash your hands with soap and water for at least 20 seconds or use an alcohol-based hand rub.  Wash your [facility] your hands often and thoroughly with soap and water for at least 20 seconds. You can use an alcohol-based hand sanitizer if soap and water are not available and if your hands are not visibly dirty. Avoid touching your eyes, nose, and mouth with unwashed hands.   Prevention Steps for Caregivers and Household Members of Individuals Confirmed to have, or Being Evaluated for, COVID-19 Infection Being Cared for in the Home  If you live with, or provide care at home for, a person confirmed to have, or being evaluated for, COVID-19 infection please follow these guidelines to prevent infection:  Follow healthcare providers instructions Make sure that you understand and can help the patient follow any healthcare provider instructions for all care.  Provide for the patients basic needs You should help the patient with basic needs in the home and provide support for getting groceries, prescriptions, and other personal needs.  Monitor the patients symptoms If they are getting sicker, call his or her medical provider and tell them that the patient has, or is being evaluated for, COVID-19 infection. This will help the healthcare  providers office take steps to keep other people from getting infected. Ask the healthcare provider to call the local or state health department.  Limit the number of people who have contact with the patient If possible, have only one caregiver for the  patient. Other household members should stay in another home or place of residence. If this is not possible, they should stay in another room, or be separated from the patient as much as possible. Use a separate bathroom, if available. Restrict visitors who do not have an essential need to be in the home.  Keep older adults, very young children, and other sick people away from the patient Keep older adults, very young children, and those who have compromised immune systems or chronic health conditions away from the patient. This includes people with chronic heart, lung, or kidney conditions, diabetes, and cancer.  Ensure good ventilation Make sure that shared spaces in the home have good air flow, such as from an air conditioner or an opened window, weather permitting.  Wash your hands often Wash your hands often and thoroughly with soap and water for at least 20 seconds. You can use an alcohol based hand sanitizer if soap and water are not available and if your hands are not visibly dirty. Avoid touching your eyes, nose, and mouth with unwashed hands. Use disposable paper towels to dry your hands. If not available, use dedicated cloth towels and replace them when they become wet.  Wear a facemask and gloves Wear a disposable facemask at all times in the room and gloves when you touch or have contact with the patients blood, body fluids, and/or secretions or excretions, such as sweat, saliva, sputum, nasal mucus, vomit, urine, or feces.  Ensure the mask fits over your nose and mouth tightly, and do not touch it during use. Throw out disposable facemasks and gloves after using them. Do not reuse. Wash your hands immediately after removing your facemask and gloves. If your personal clothing becomes contaminated, carefully remove clothing and launder. Wash your hands after handling contaminated clothing. Place all used disposable facemasks, gloves, and other waste in a lined container before  disposing them with other household waste. Remove gloves and wash your hands immediately after handling these items.  Do not share dishes, glasses, or other household items with the patient Avoid sharing household items. You should not share dishes, drinking glasses, cups, eating utensils, towels, bedding, or other items with a patient who is confirmed to have, or being evaluated for, COVID-19 infection. After the person uses these items, you should wash them thoroughly with soap and water.  Wash laundry thoroughly Immediately remove and wash clothes or bedding that have blood, body fluids, and/or secretions or excretions, such as sweat, saliva, sputum, nasal mucus, vomit, urine, or feces, on them. Wear gloves when handling laundry from the patient. Read and follow directions on labels of laundry or clothing items and detergent. In general, wash and dry with the warmest temperatures recommended on the label.  Clean all areas the individual has used often Clean all touchable surfaces, such as counters, tabletops, doorknobs, bathroom fixtures, toilets, phones, keyboards, tablets, and bedside tables, every day. Also, clean any surfaces that may have blood, body fluids, and/or secretions or excretions on them. Wear gloves when cleaning surfaces the patient has come in contact with. Use a diluted bleach solution (e.g., dilute bleach with 1 part bleach and 10 parts water) or a household disinfectant with a label that says EPA-registered for coronaviruses. To make  a bleach solution at home, add 1 tablespoon of bleach to 1 quart (4 cups) of water. For a larger supply, add  cup of bleach to 1 gallon (16 cups) of water. Read labels of cleaning products and follow recommendations provided on product labels. Labels contain instructions for safe and effective use of the cleaning product including precautions you should take when applying the product, such as wearing gloves or eye protection and making sure you  have good ventilation during use of the product. Remove gloves and wash hands immediately after cleaning.  Monitor yourself for signs and symptoms of illness Caregivers and household members are considered close contacts, should monitor their health, and will be asked to limit movement outside of the home to the extent possible. Follow the monitoring steps for close contacts listed on the symptom monitoring form.   ? If you have additional questions, contact your local health department or call the epidemiologist on call at [phone] (available 24/7). ? This guidance is subject to change. For the most up-to-date guidance from [facility], please refer to their website: [email or web address]

## 2019-05-06 NOTE — ED Provider Notes (Signed)
[place] EMERGENCY DEPARTMENT Provider Note   CSN: [id number] Arrival date & time: 05/06/19  1116     History   Chief Complaint Chief Complaint  Patient presents with   Sore Throat    HPI Stephanie Mcgrath is a 48 y.o. female.     48yo F w/ PMH below who p/w sore throat. Pt reports that she spent time with a friend over the weekend who subsequently tested positive for COVID-19. Pt began having sore throat 2 days ago associated with low-grade fever for which she took [facility] powder and felt better. She has had 2 episodes of diarrhea, no vomiting or cough. She otherwise feels fine and denies any breathing problems.  The history is provided by the patient.  Sore Throat    Past Medical History:  Diagnosis Date   Appendicitis, acute, with peritonitis 02/17/2013   Asthma    BMI 28.0-28.9,adult 02/17/2013   Unspecified asthma(493.90) 02/17/2013    Patient Active Problem List   Diagnosis Date Noted   Appendicitis, acute, with peritonitis 02/17/2013   Unspecified asthma(493.90) 02/17/2013   BMI 28.0-28.9,adult 02/17/2013    Past Surgical History:  Procedure Laterality Date   APPENDECTOMY     CESAREAN SECTION     FOOT SURGERY     left   LAPAROSCOPIC APPENDECTOMY N/A 02/14/2013   Procedure: APPENDECTOMY LAPAROSCOPIC;  Surgeon: Edward Jolly, MD;  Location: [facility];  Service: General;  Laterality: N/A;   TUBAL LIGATION       OB History   No obstetric history on file.      Home Medications    Prior to Admission medications   Medication Sig Start Date End Date Taking? Authorizing Provider  albuterol (PROVENTIL HFA;VENTOLIN HFA) 108 (90 BASE) MCG/ACT inhaler Inhale 2 puffs into the lungs every 4 (four) hours as needed. For tightness     [provider]  albuterol (PROVENTIL) (5 MG/ML) 0.5% nebulizer solution Take 2.5 mg by nebulization every 4 (four) hours as needed.     [provider]  cyclobenzaprine (FLEXERIL) 10 MG tablet Take  10 mg by mouth as needed.  06/28/15   [provider]  ibuprofen (ADVIL,MOTRIN) 200 MG tablet You can take 2-3 tablets every 6 hours for pain. 02/17/13   Earnstine Regal, PA-C  montelukast (SINGULAIR) 10 MG tablet Take 10 mg by mouth as needed.  03/15/14   [provider]  naproxen (NAPROSYN) 500 MG tablet Take 1 tablet (500 mg total) by mouth 2 (two) times daily. 04/24/16   Frederica Kuster, PA-C  NONFORMULARY OR COMPOUNDED [facility] Compound:  Antiinflammatory Cream - Diclofenac 3%, Baclofen 2%, Cyclobenzaprine 2%, Lidocaine 2%, dispense 180gm, apply 1-2 grams to affected area 3-4 times daily, +3refills. 08/29/15   Wallene Huh, DPM  phentermine (ADIPEX-P) 37.5 MG tablet Take 37.5 mg by mouth daily before breakfast.    [provider]    Family History No family history on file.  Social History Social History   Tobacco Use   Smoking status: Current Some Day Smoker    Types: Cigarettes   Smokeless tobacco: Never Used  Substance Use Topics   Alcohol use: Yes    Comment: weekly   Drug use: No     Allergies   Penicillins   Review of Systems Review of Systems All other systems reviewed and are negative except that which was mentioned in HPI   Physical Exam Updated Vital Signs BP (!) 158/86 (BP Location: Right Arm)    Pulse  95    Temp 98.8 F (37.1 C) (Oral)    Resp 16    Ht 5' (1.524 m)    Wt 74.8 kg    SpO2 100%    BMI 32.22 kg/m   Physical Exam Vitals signs and nursing note reviewed.  Constitutional:      General: She is not in acute distress.    Appearance: She is well-developed.  HENT:     Head: Normocephalic and atraumatic.     Mouth/Throat:     Mouth: Mucous membranes are moist.     Pharynx: Oropharynx is clear. Uvula midline.  Eyes:     Conjunctiva/sclera: Conjunctivae normal.  Neck:     Musculoskeletal: Neck supple.  Cardiovascular:     Rate and Rhythm: Normal rate.  Pulmonary:     Effort: Pulmonary effort is  normal.  Skin:    General: Skin is warm and dry.  Neurological:     Mental Status: She is alert and oriented to person, place, and time.  Psychiatric:        Mood and Affect: Mood normal.        Behavior: Behavior normal.        Judgment: Judgment normal.      ED Treatments / Results  Labs (all labs ordered are listed, but only abnormal results are displayed) Labs Reviewed  NOVEL CORONAVIRUS, NAA (HOSP ORDER, SEND-OUT TO REF LAB; TAT 18-24 HRS)    EKG None  Radiology No results found.  Procedures Procedures (including critical care time)  Medications Ordered in ED Medications - No data to display   Initial Impression / Assessment and Plan / ED Course  I have reviewed the triage vital signs and the nursing notes.        Well appearing, pleasant on exam w/ reassuring Vs, afebrile and 100% on RA. Denies breathing problems. Offered COVID-19 testing and discussed strict quarantine while awaiting results. Extensively reviewed return precautions and she voiced understanding.   Stephanie Mcgrath was evaluated in Emergency Department on 05/06/2019 for the symptoms described in the history of present illness. She was evaluated in the context of the global COVID-19 pandemic, which necessitated consideration that the patient might be at risk for infection with the SARS-CoV-2 virus that causes COVID-19. Institutional protocols and algorithms that pertain to the evaluation of patients at risk for COVID-19 are in a state of rapid change based on information released by regulatory bodies including the CDC and federal and state organizations. These policies and algorithms were followed during the patient's care in the ED.  Final Clinical Impressions(s) / ED Diagnoses   Final diagnoses:  Suspected COVID-19 virus infection  Sore throat  Diarrhea of presumed infectious origin    ED Discharge Orders    None       Silas Muff, Ambrose Finland, MD 05/06/19 1158

## 2019-05-06 NOTE — ED Triage Notes (Addendum)
Pt c/o sore throat, "slight fever", diarrhea x 2 episodes-sx started 2 days ago-reports +covid exposure-NAD-steady gait-pt anxious/ requesting covid test

## 2019-05-07 LAB — NOVEL CORONAVIRUS, NAA (HOSP ORDER, SEND-OUT TO REF LAB; TAT 18-24 HRS)
Coronavirus Source: NONE SEEN
SARS-CoV-2, NAA: NOT DETECTED

## 2023-07-31 ENCOUNTER — Emergency Department (HOSPITAL_BASED_OUTPATIENT_CLINIC_OR_DEPARTMENT_OTHER)
Admission: EM | Admit: 2023-07-31 | Discharge: 2023-07-31 | Disposition: A | Discharge status: Home / Self Care | Payer: Medicaid Other | Attending: Emergency Medicine | Admitting: Emergency Medicine

## 2023-07-31 ENCOUNTER — Emergency Department (HOSPITAL_BASED_OUTPATIENT_CLINIC_OR_DEPARTMENT_OTHER): Payer: Medicaid Other | Admitting: Radiology

## 2023-07-31 ENCOUNTER — Other Ambulatory Visit: Payer: Self-pay

## 2023-07-31 ENCOUNTER — Encounter (HOSPITAL_BASED_OUTPATIENT_CLINIC_OR_DEPARTMENT_OTHER): Payer: Self-pay | Admitting: Emergency Medicine

## 2023-07-31 DIAGNOSIS — J45909 Unspecified asthma, uncomplicated: Secondary | ICD-10-CM | POA: Insufficient documentation

## 2023-07-31 DIAGNOSIS — Z7951 Long term (current) use of inhaled steroids: Secondary | ICD-10-CM | POA: Insufficient documentation

## 2023-07-31 DIAGNOSIS — M25512 Pain in left shoulder: Secondary | ICD-10-CM | POA: Insufficient documentation

## 2023-07-31 MED ORDER — OXYCODONE-ACETAMINOPHEN 5-325 MG PO TABS
2.0000 | ORAL_TABLET | Freq: Once | ORAL | Status: AC
  Administered 2023-07-31: 2 via ORAL
  Filled 2023-07-31: qty 2

## 2023-07-31 MED ORDER — NAPROXEN 500 MG PO TABS: 500.0000 mg | ORAL_TABLET | Freq: Two times a day (BID) | ORAL | 0 refills | Status: AC

## 2023-07-31 MED ORDER — HYDROCODONE-ACETAMINOPHEN 5-325 MG PO TABS: 1.0000 | ORAL_TABLET | Freq: Four times a day (QID) | ORAL | 0 refills | Status: AC | PRN

## 2023-07-31 MED ORDER — IBUPROFEN 800 MG PO TABS
800.0000 mg | ORAL_TABLET | Freq: Once | ORAL | Status: AC
  Administered 2023-07-31: 800 mg via ORAL
  Filled 2023-07-31: qty 1

## 2023-07-31 NOTE — Discharge Instructions (Addendum)
Begin taking naproxen as prescribed.  Begin taking hydrocodone as prescribed as needed for pain not relieved with naproxen.  Wear arm sling for comfort and support.  Follow-up with primary doctor if not improving in the next week.

## 2023-07-31 NOTE — ED Triage Notes (Signed)
Left shoulder pain since Sunday, got Rx on Tuesday with no relief and stopped Rx. Denies injury.

## 2023-07-31 NOTE — ED Provider Notes (Signed)
[place] EMERGENCY DEPARTMENT AT [facility] Provider Note   CSN: [id number] Arrival date & time: 07/31/23  0351     History  Chief Complaint  Patient presents with   Shoulder Pain    Stephanie Mcgrath is a 53 y.o. female.  Patient is a 53 year old female with past medical history of asthma.  Patient presenting today with complaints of left shoulder pain.  This has been hurting for the past 5 days.  Begin to the absence of any injury or trauma.  She describes severe pain to the back of the left shoulder radiating towards her neck.  This is worse when she moves her arm or palpates the area.  She has been taking over-the-counter medications with little relief.  The history is provided by the patient.       Home Medications Prior to Admission medications   Medication Sig Start Date End Date Taking? Authorizing Provider  albuterol (PROVENTIL HFA;VENTOLIN HFA) 108 (90 BASE) MCG/ACT inhaler Inhale 2 puffs into the lungs every 4 (four) hours as needed. For tightness     [provider]  albuterol (PROVENTIL) (5 MG/ML) 0.5% nebulizer solution Take 2.5 mg by nebulization every 4 (four) hours as needed.     [provider]  cyclobenzaprine (FLEXERIL) 10 MG tablet Take 10 mg by mouth as needed.  06/28/15   [provider]  ibuprofen (ADVIL,MOTRIN) 200 MG tablet You can take 2-3 tablets every 6 hours for pain. 02/17/13   Sherrie George, PA-C  montelukast (SINGULAIR) 10 MG tablet Take 10 mg by mouth as needed.  03/15/14   [provider]  naproxen (NAPROSYN) 500 MG tablet Take 1 tablet (500 mg total) by mouth 2 (two) times daily. 04/24/16   Emi Holes, PA-C  NONFORMULARY OR COMPOUNDED ITEM [facility] Compound:  Antiinflammatory Cream - Diclofenac 3%, Baclofen 2%, Cyclobenzaprine 2%, Lidocaine 2%, dispense 180gm, apply 1-2 grams to affected area 3-4 times daily, +3refills. 08/29/15   Lenn Sink, DPM  phentermine (ADIPEX-P) 37.5 MG  tablet Take 37.5 mg by mouth daily before breakfast.    [provider]      Allergies    Penicillins    Review of Systems   Review of Systems  All other systems reviewed and are negative.   Physical Exam Updated Vital Signs BP (!) 157/102 (BP Location: Right Arm)   Pulse 75   Temp 98.6 F (37 C) (Oral)   Resp 18   Ht 4\' 11"  (1.499 m)   Wt 74.8 kg   LMP 04/17/2016   SpO2 100%   BMI 33.33 kg/m  Physical Exam Vitals and nursing note reviewed.  Constitutional:      Appearance: Normal appearance.  Pulmonary:     Effort: Pulmonary effort is normal.  Musculoskeletal:     Comments: The left shoulder appears grossly normal.  She does have good range of motion of the shoulder, but is quite tender to palpation to the posterior deltoid extending into the trapezius muscle.  Motor and sensation are intact throughout the entire hand.  Ulnar and radial pulses are easily palpable.  Skin:    General: Skin is warm and dry.  Neurological:     Mental Status: She is alert and oriented to person, place, and time.     ED Results / Procedures / Treatments   Labs (all labs ordered are listed, but only abnormal results are displayed) Labs Reviewed - No data to display  EKG None  Radiology DG Shoulder Left  Result Date: 07/31/2023 CLINICAL DATA:  Left shoulder pain since Sunday EXAM: LEFT SHOULDER - 3 VIEW COMPARISON:  04/24/2016 FINDINGS: There is no evidence of fracture or dislocation. Mild degenerative spurring at the [facility] joint. Faint calcification or ossification adjacent to the greater tuberosity which is stable. IMPRESSION: No acute finding or change from 2017. Electronically Signed   By: Tiburcio Pea M.D.   On: 07/31/2023 05:10    Procedures Procedures    Medications Ordered in ED Medications  ibuprofen (ADVIL) tablet 800 mg (has no administration in time range)  oxyCODONE-acetaminophen (PERCOCET/ROXICET) 5-325 MG per tablet 2 tablet (has no administration in time  range)    ED Course/ Medical Decision Making/ A&P  Patient presenting with left shoulder pain, possibly rotator cuff tendinitis.  Her x-rays are negative.  Patient will be placed in an arm sling and treated with NSAIDs and pain medication.  She is to follow-up as needed if not improving.  Final Clinical Impression(s) / ED Diagnoses Final diagnoses:  None    Rx / DC Orders ED Discharge Orders     None         Geoffery Lyons, MD 07/31/23 [phone]
